# Patient Record
Sex: Female | Born: 1965 | Race: White | Hispanic: No | Smoking: Never smoker
Health system: Southern US, Community
[De-identification: ages and names within clinical notes are randomized; demographics above are authoritative.]

## PROBLEM LIST (undated history)

## (undated) DIAGNOSIS — I739 Peripheral vascular disease, unspecified: Secondary | ICD-10-CM

## (undated) DIAGNOSIS — Z8619 Personal history of other infectious and parasitic diseases: Secondary | ICD-10-CM

## (undated) DIAGNOSIS — I471 Supraventricular tachycardia, unspecified: Secondary | ICD-10-CM

## (undated) DIAGNOSIS — I341 Nonrheumatic mitral (valve) prolapse: Secondary | ICD-10-CM

## (undated) DIAGNOSIS — F32A Depression, unspecified: Secondary | ICD-10-CM

## (undated) DIAGNOSIS — Z8669 Personal history of other diseases of the nervous system and sense organs: Secondary | ICD-10-CM

## (undated) DIAGNOSIS — I809 Phlebitis and thrombophlebitis of unspecified site: Secondary | ICD-10-CM

## (undated) DIAGNOSIS — E785 Hyperlipidemia, unspecified: Secondary | ICD-10-CM

## (undated) DIAGNOSIS — F329 Major depressive disorder, single episode, unspecified: Secondary | ICD-10-CM

## (undated) DIAGNOSIS — I951 Orthostatic hypotension: Secondary | ICD-10-CM

## (undated) DIAGNOSIS — M1990 Unspecified osteoarthritis, unspecified site: Secondary | ICD-10-CM

## (undated) HISTORY — DX: Unspecified osteoarthritis, unspecified site: M19.90

## (undated) HISTORY — DX: Hyperlipidemia, unspecified: E78.5

## (undated) HISTORY — DX: Depression, unspecified: F32.A

## (undated) HISTORY — DX: Personal history of other diseases of the nervous system and sense organs: Z86.69

## (undated) HISTORY — DX: Personal history of other infectious and parasitic diseases: Z86.19

## (undated) HISTORY — DX: Orthostatic hypotension: I95.1

## (undated) HISTORY — DX: Nonrheumatic mitral (valve) prolapse: I34.1

## (undated) HISTORY — DX: Peripheral vascular disease, unspecified: I73.9

## (undated) HISTORY — PX: KNEE SURGERY: SHX244

## (undated) HISTORY — DX: Supraventricular tachycardia, unspecified: I47.10

## (undated) HISTORY — DX: Phlebitis and thrombophlebitis of unspecified site: I80.9

## (undated) HISTORY — DX: Major depressive disorder, single episode, unspecified: F32.9

## (undated) HISTORY — DX: Supraventricular tachycardia: I47.1

---

## 2002-06-11 HISTORY — PX: HERNIA REPAIR: SHX51

## 2004-06-11 HISTORY — PX: CARDIAC CATHETERIZATION: SHX172

## 2012-02-25 ENCOUNTER — Encounter: Payer: Self-pay | Admitting: Family

## 2012-02-25 ENCOUNTER — Ambulatory Visit (INDEPENDENT_AMBULATORY_CARE_PROVIDER_SITE_OTHER): Payer: 59 | Admitting: Family

## 2012-02-25 VITALS — BP 100/70 | HR 78 | Temp 98.4°F | Resp 16 | Ht 70.25 in | Wt 139.0 lb

## 2012-02-25 DIAGNOSIS — G43909 Migraine, unspecified, not intractable, without status migrainosus: Secondary | ICD-10-CM

## 2012-02-25 DIAGNOSIS — I471 Supraventricular tachycardia: Secondary | ICD-10-CM

## 2012-02-25 DIAGNOSIS — I498 Other specified cardiac arrhythmias: Secondary | ICD-10-CM

## 2012-02-25 DIAGNOSIS — F411 Generalized anxiety disorder: Secondary | ICD-10-CM

## 2012-02-25 DIAGNOSIS — M129 Arthropathy, unspecified: Secondary | ICD-10-CM

## 2012-02-25 DIAGNOSIS — F419 Anxiety disorder, unspecified: Secondary | ICD-10-CM

## 2012-02-25 DIAGNOSIS — I951 Orthostatic hypotension: Secondary | ICD-10-CM

## 2012-02-25 DIAGNOSIS — M1990 Unspecified osteoarthritis, unspecified site: Secondary | ICD-10-CM

## 2012-02-25 MED ORDER — FLUDROCORTISONE ACETATE 0.1 MG PO TABS: 0.1000 mg | ORAL_TABLET | Freq: Two times a day (BID) | ORAL | Status: DC

## 2012-02-25 MED ORDER — BUTALBITAL-ASA-CAFF-CODEINE 50-325-40-30 MG PO CAPS: 1.0000 | ORAL_CAPSULE | ORAL | Status: DC | PRN

## 2012-02-25 MED ORDER — DIAZEPAM 5 MG PO TABS: 5.0000 mg | ORAL_TABLET | Freq: Three times a day (TID) | ORAL | Status: DC | PRN
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
## 2012-02-25 NOTE — Patient Instructions (Addendum)
You will be contact about your referral to the headache specialist. Please let us know if you have not heard back within 1 week about your referral. Schedule a fasting physical at your convenience.

## 2012-02-28 DIAGNOSIS — M1990 Unspecified osteoarthritis, unspecified site: Secondary | ICD-10-CM | POA: Insufficient documentation

## 2012-02-28 DIAGNOSIS — F419 Anxiety disorder, unspecified: Secondary | ICD-10-CM | POA: Insufficient documentation

## 2012-02-28 DIAGNOSIS — I951 Orthostatic hypotension: Secondary | ICD-10-CM | POA: Insufficient documentation

## 2012-02-28 DIAGNOSIS — G43909 Migraine, unspecified, not intractable, without status migrainosus: Secondary | ICD-10-CM | POA: Insufficient documentation

## 2012-02-28 NOTE — Assessment & Plan Note (Addendum)
Uncontrolled.  Refer to Neuro for further evaluation.  Rx provided for fiorinal, but we discussed that this is not an ideal medication for long term.

## 2012-02-28 NOTE — Assessment & Plan Note (Signed)
Well controlled off of citalopram.  Monitor.

## 2012-02-28 NOTE — Assessment & Plan Note (Signed)
This affects her back and knees.  She also has neck pain and stiffness for which she uses valium.

## 2012-02-28 NOTE — Assessment & Plan Note (Signed)
Seem controlled with florinef and high sodium diet.  Monitor.

## 2012-03-04 ENCOUNTER — Encounter: Payer: Self-pay | Admitting: Cardiology

## 2012-03-04 ENCOUNTER — Telehealth: Payer: Self-pay | Admitting: Family

## 2012-03-04 ENCOUNTER — Encounter: Payer: Self-pay | Admitting: *Deleted

## 2012-03-04 DIAGNOSIS — I499 Cardiac arrhythmia, unspecified: Secondary | ICD-10-CM | POA: Insufficient documentation

## 2012-03-04 DIAGNOSIS — F32A Depression, unspecified: Secondary | ICD-10-CM | POA: Insufficient documentation

## 2012-03-04 DIAGNOSIS — Z8669 Personal history of other diseases of the nervous system and sense organs: Secondary | ICD-10-CM | POA: Insufficient documentation

## 2012-03-04 DIAGNOSIS — E785 Hyperlipidemia, unspecified: Secondary | ICD-10-CM | POA: Insufficient documentation

## 2012-03-04 DIAGNOSIS — F329 Major depressive disorder, single episode, unspecified: Secondary | ICD-10-CM | POA: Insufficient documentation

## 2012-03-04 DIAGNOSIS — I809 Phlebitis and thrombophlebitis of unspecified site: Secondary | ICD-10-CM | POA: Insufficient documentation

## 2012-03-04 DIAGNOSIS — Z8619 Personal history of other infectious and parasitic diseases: Secondary | ICD-10-CM | POA: Insufficient documentation

## 2012-03-04 NOTE — Telephone Encounter (Signed)
Received medical records from [facility]-Dr. Yunus  P: [phone] F: [phone]

## 2012-03-05 ENCOUNTER — Ambulatory Visit (INDEPENDENT_AMBULATORY_CARE_PROVIDER_SITE_OTHER): Payer: 59 | Admitting: Cardiology

## 2012-03-05 ENCOUNTER — Encounter: Payer: Self-pay | Admitting: Cardiology

## 2012-03-05 VITALS — BP 115/80 | HR 72 | Ht 70.0 in | Wt 140.0 lb

## 2012-03-05 DIAGNOSIS — I341 Nonrheumatic mitral (valve) prolapse: Secondary | ICD-10-CM

## 2012-03-05 DIAGNOSIS — R002 Palpitations: Secondary | ICD-10-CM

## 2012-03-05 DIAGNOSIS — I059 Rheumatic mitral valve disease, unspecified: Secondary | ICD-10-CM

## 2012-03-05 DIAGNOSIS — I951 Orthostatic hypotension: Secondary | ICD-10-CM

## 2012-03-05 NOTE — Progress Notes (Signed)
HPI: 46 year old female for evaluation of palpitations. Patient states she had an evaluation in [place] previously for chest pain, dyspnea and palpitations. This included a cardiac catheterization which she states showed normal coronary arteries. Records are not available. She also was told she had mitral valve prolapse and SVT. She has had previous tilt table which apparently showed significant orthostasis and she is treated with Florinef. She then moved to [place] and had an echocardiogram in March of 2012 which showed normal LV function, mild mitral regurgitation and trace tricuspid regurgitation. Monitor showed sinus rhythm with occasional PVC. She recently moved to this area. She presents for evaluation of the above. She has some dyspnea on exertion which is chronic and unchanged. No orthopnea or PND but chronic mild pedal edema. She occasionally has chest pressure with stress but not with exertion. She occasionally has palpitations that are nonsustained. All of her symptoms are chronic and unchanged by her report. She has some dizziness with standing.  Current Outpatient Prescriptions  Medication Sig Dispense Refill  . butalbital-aspirin-caffeine-codeine (FIORINAL WITH CODEINE) 50-325-40-30 MG capsule Take 1 capsule by mouth every 4 (four) hours as needed.  90 capsule  0  . CALCIUM CITRATE-VITAMIN D PO Take 1 tablet by mouth daily.      . citalopram (CELEXA) 40 MG tablet Take 40 mg by mouth daily.      . diazepam (VALIUM) 5 MG tablet Take 1 tablet (5 mg total) by mouth every 8 (eight) hours as needed for anxiety.  90 tablet  0  . Ergocalciferol (VITAMIN D2) 2000 UNITS TABS Take 1 tablet by mouth daily.      . fludrocortisone (FLORINEF) 0.1 MG tablet Take 1 tablet (0.1 mg total) by mouth 2 (two) times daily.  60 tablet  2  . Multiple Vitamins-Minerals (CENTRUM ULTRA WOMENS PO) Take 1 tablet by mouth daily.      . vitamin E 400 UNIT capsule Take 800 Units by mouth daily.        Allergies    Allergen Reactions  . Sulfa Antibiotics Swelling    Past Medical History  Diagnosis Date  . Arthritis   . Depression   . History of chicken pox   . History of migraine   . Hyperlipidemia   . Phlebitis     Pt reports phlebitis while wearing a leg immobilizer years ago  . MVP (mitral valve prolapse)   . SVT (supraventricular tachycardia)   . Orthostatic hypotension     Past Surgical History  Procedure Date  . Knee surgery     x 4  . Hernia repair 2004    right inguinal  . Cardiac catheterization 2006    Performed in [place], no CAD    History   Social History  . Marital Status: Divorced    Spouse Name: N/A    Number of Children: 1  . Years of Education: N/A   Occupational History  .  [employer]   Social History Main Topics  . Smoking status: Never Smoker   . Smokeless tobacco: Never Used  . Alcohol Use: Yes     2 per month  . Drug Use: Not on file  . Sexually Active: Not on file   Other Topics Concern  . Not on file   Social History Narrative   Regular exercise:  NoCaffeine use:  2 cups coffee dailyPatient is EngagedAccountantBachelors degreeShe has one son age 19She enjoys hiking    Family History  Problem Relation Age of Onset  .  Arthritis Mother   . Hyperlipidemia Mother   . Hypertension Mother   . Vitamin D deficiency Mother   . Arthritis Father   . Hyperlipidemia Father   . Hypertension Father   . Thyroid disease Sister   . Vitamin D deficiency Sister   . Hyperlipidemia Maternal Grandmother   . Hypertension Maternal Grandmother   . Aneurysm Maternal Grandmother     abdmonial  . Hypertension Maternal Grandfather   . Hyperlipidemia Paternal Grandmother   . Hypertension Paternal Grandmother   . Hypertension Paternal Grandfather   . Aneurysm Brother     abdominal    ROS:  no fevers or chills, productive cough, hemoptysis, dysphasia, odynophagia, melena, hematochezia, dysuria, hematuria, rash, seizure activity, orthopnea, PND,  claudication. Remaining systems are negative.  Physical Exam:  Blood pressure 115/80, pulse 72, height 5\' 10"  (1.778 m), weight 140 lb (63.504 kg), last menstrual period 01/30/2012.  General:  Well developed/well nourished in NAD Skin warm/dry Patient not depressed No peripheral clubbing Back-normal HEENT-normal/normal eyelids Neck supple/normal carotid upstroke bilaterally; no bruits; no JVD; no thyromegaly chest - CTA/ normal expansion CV - RRR/normal S1 and S2; no murmurs, rubs or gallops;  PMI nondisplaced Abdomen -NT/ND, no HSM, no mass, + bowel sounds, no bruit 2+ femoral pulses, no bruits Ext-no edema, chords, 2+ DP Neuro-grossly nonfocal  ECG sinus rhythm at a rate of 72. Normal axis. No significant ST changes.

## 2012-03-05 NOTE — Assessment & Plan Note (Signed)
Patient has had long-standing palpitations. She states a previous monitor in [place] showed SVT. A monitor in [place] showed only isolated PVC. Her symptoms are actually improved compared to previous and also chronic. Will not pursue further evaluation at this point. I will obtain all previous records from [place] and [place] for review.

## 2012-03-05 NOTE — Patient Instructions (Addendum)
Your physician wants you to follow-up in: ONE YEAR WITH DR CRENSHAW IN [place] You will receive a reminder letter in the mail two months in advance. If you don't receive a letter, please call our office to schedule the follow-up appointment.  

## 2012-03-05 NOTE — Assessment & Plan Note (Signed)
Symptoms appear to be reasonable. Continue Florinef. Obtain previous records concerning tilt table.

## 2012-03-14 ENCOUNTER — Other Ambulatory Visit: Payer: Self-pay | Admitting: Family

## 2012-03-14 ENCOUNTER — Encounter: Payer: Self-pay | Admitting: Family

## 2012-03-14 ENCOUNTER — Ambulatory Visit (INDEPENDENT_AMBULATORY_CARE_PROVIDER_SITE_OTHER): Payer: 59 | Admitting: Family

## 2012-03-14 VITALS — BP 100/70 | HR 78 | Temp 98.0°F | Resp 16 | Ht 70.25 in | Wt 143.0 lb

## 2012-03-14 DIAGNOSIS — M2550 Pain in unspecified joint: Secondary | ICD-10-CM

## 2012-03-14 DIAGNOSIS — Z0000 Encounter for general adult medical examination without abnormal findings: Secondary | ICD-10-CM

## 2012-03-14 LAB — HEPATIC FUNCTION PANEL
AST: 18 U/L (ref 0–37)
Albumin: 3.7 g/dL (ref 3.5–5.2)
Alkaline Phosphatase: 54 U/L (ref 39–117)
Total Protein: 7.1 g/dL (ref 6.0–8.3)

## 2012-03-14 LAB — CBC WITH DIFFERENTIAL/PLATELET
Basophils Absolute: 0 10*3/uL (ref 0.0–0.1)
Basophils Relative: 1 % (ref 0–1)
MCHC: 32.4 g/dL (ref 30.0–36.0)
Neutro Abs: 2.4 10*3/uL (ref 1.7–7.7)
Neutrophils Relative %: 62 % (ref 43–77)
Platelets: 303 10*3/uL (ref 150–400)
RDW: 13.7 % (ref 11.5–15.5)

## 2012-03-14 LAB — BASIC METABOLIC PANEL WITH GFR
BUN: 11 mg/dL (ref 6–23)
CO2: 26 mEq/L (ref 19–32)
Calcium: 9.2 mg/dL (ref 8.4–10.5)
Chloride: 103 mEq/L (ref 96–112)
Creat: 0.76 mg/dL (ref 0.50–1.10)
GFR, Est Non African American: >89 mL/min

## 2012-03-14 LAB — RHEUMATOID FACTOR: Rhuematoid fact SerPl-aCnc: <10 IU/mL (ref <=14)

## 2012-03-14 LAB — LIPID PANEL
Cholesterol: 240 mg/dL — ABNORMAL HIGH (ref 0–200)
LDL Cholesterol: 151 mg/dL — ABNORMAL HIGH (ref 0–99)
Triglycerides: 59 mg/dL (ref <150)

## 2012-03-14 NOTE — Progress Notes (Signed)
Subjective:    Patient ID: Denise [name], female    DOB: 1966-03-31, 46 y.o.   MRN: [id number]  HPI  Patient presents today for complete physical.  Immunizations:Tetanus up to date. Declines flu shot Diet: Not eating healthy.   Colonoscopy: Plan to start at age 39 Pap Smear: Will complete with GYN Mammogram:Due for mammogram  Review of Systems  Constitutional: Positive for fatigue.       8-9 hours sleep a day  HENT: Negative for hearing loss and congestion.   Eyes: Negative for visual disturbance.  Respiratory: Negative for cough.   Cardiovascular: Positive for leg swelling.  Gastrointestinal: Negative for vomiting, diarrhea, constipation and blood in stool.  Genitourinary: Negative for menstrual problem.  Musculoskeletal:       Knee pain, hip pain, shoulder pain, back pain from scoliosis  Skin: Negative for rash.  Neurological: Positive for headaches.  Hematological: Does not bruise/bleed easily.  Psychiatric/Behavioral:       Reports mood is generally good   Past Medical History  Diagnosis Date  . Arthritis   . Depression   . History of chicken pox   . History of migraine   . Hyperlipidemia   . Phlebitis     Pt reports phlebitis while wearing a leg immobilizer years ago  . MVP (mitral valve prolapse)   . SVT (supraventricular tachycardia)   . Orthostatic hypotension     History   Social History  . Marital Status: Divorced    Spouse Name: N/A    Number of Children: 1  . Years of Education: N/A   Occupational History  .  [employer]   Social History Main Topics  . Smoking status: Never Smoker   . Smokeless tobacco: Never Used  . Alcohol Use: Yes     2 per month  . Drug Use: Not on file  . Sexually Active: Not on file   Other Topics Concern  . Not on file   Social History Narrative   Regular exercise:  NoCaffeine use:  2 cups coffee dailyPatient is EngagedAccountantBachelors degreeShe has one son age 19She enjoys hiking    Past Surgical  History  Procedure Date  . Knee surgery     x 4  . Hernia repair 2004    right inguinal  . Cardiac catheterization 2006    Performed in [place], no CAD    Family History  Problem Relation Age of Onset  . Arthritis Mother   . Hyperlipidemia Mother   . Hypertension Mother   . Vitamin D deficiency Mother   . Arthritis Father   . Hyperlipidemia Father   . Hypertension Father   . Thyroid disease Sister   . Vitamin D deficiency Sister   . Hyperlipidemia Maternal Grandmother   . Hypertension Maternal Grandmother   . Aneurysm Maternal Grandmother     abdmonial  . Hypertension Maternal Grandfather   . Hyperlipidemia Paternal Grandmother   . Hypertension Paternal Grandmother   . Hypertension Paternal Grandfather   . Aneurysm Brother     abdominal    Allergies  Allergen Reactions  . Sulfa Antibiotics Swelling    Current Outpatient Prescriptions on File Prior to Visit  Medication Sig Dispense Refill  . butalbital-aspirin-caffeine-codeine (FIORINAL WITH CODEINE) 50-325-40-30 MG capsule Take 1 capsule by mouth every 4 (four) hours as needed.  90 capsule  0  . CALCIUM CITRATE-VITAMIN D PO Take 1 tablet by mouth daily.      . citalopram (CELEXA) 40 MG tablet Take 40  mg by mouth daily.      . diazepam (VALIUM) 5 MG tablet Take 1 tablet (5 mg total) by mouth every 8 (eight) hours as needed for anxiety.  90 tablet  0  . Ergocalciferol (VITAMIN D2) 2000 UNITS TABS Take 1 tablet by mouth daily.      . fludrocortisone (FLORINEF) 0.1 MG tablet Take 1 tablet (0.1 mg total) by mouth 2 (two) times daily.  60 tablet  2  . Multiple Vitamins-Minerals (CENTRUM ULTRA WOMENS PO) Take 1 tablet by mouth daily.      . vitamin E 400 UNIT capsule Take 800 Units by mouth daily.        BP 100/70  Pulse 78  Temp 98 F (36.7 C) (Oral)  Resp 16  Ht 5' 10.25" (1.784 m)  Wt 143 lb (64.864 kg)  BMI 20.37 kg/m2  LMP 02/27/2012        Objective:   Physical Exam Physical Exam  Constitutional: She is  oriented to person, place, and time. She appears well-developed and well-nourished. No distress.  HENT:  Head: Normocephalic and atraumatic.  Right Ear: Tympanic membrane and ear canal normal.  Left Ear: Tympanic membrane and ear canal normal.  Mouth/Throat: Oropharynx is clear and moist.  Eyes: Pupils are equal, round, and reactive to light. No scleral icterus.  Neck: Normal range of motion. No thyromegaly present.  Cardiovascular: Normal rate and regular rhythm.   No murmur heard. Pulmonary/Chest: Effort normal and breath sounds normal. No respiratory distress. He has no wheezes. She has no rales. She exhibits no tenderness.  Abdominal: Soft. Bowel sounds are normal. He exhibits no distension and no mass. There is no tenderness. There is no rebound and no guarding.  Musculoskeletal: She exhibits no edema.  Lymphadenopathy:    She has no cervical adenopathy.  Neurological: She is alert and oriented to person, place, and time.  She exhibits normal muscle tone. Coordination normal.  Skin: Skin is warm and dry.  Psychiatric: She has a normal mood and affect. Her behavior is normal. Judgment and thought content normal.  Breasts: Examined lying Right: Without masses, retractions, discharge or axillary adenopathy.  Left: Without masses, retractions, discharge or axillary adenopathy.  Pelvic- defer to GYN          Assessment & Plan:          Assessment & Plan:

## 2012-03-14 NOTE — Assessment & Plan Note (Signed)
Will check ANA,RA,ESR.   

## 2012-03-14 NOTE — Patient Instructions (Addendum)
Please schedule mammogram on the first floor. Complete your blood work prior to leaving. Follow up in 6 months, sooner if problems/concerns.

## 2012-03-15 LAB — URINALYSIS, ROUTINE W REFLEX MICROSCOPIC
Bilirubin Urine: NEGATIVE
Glucose, UA: NEGATIVE mg/dL
Specific Gravity, Urine: 1.008 (ref 1.005–1.030)
Urobilinogen, UA: 0.2 mg/dL (ref 0.0–1.0)

## 2012-03-15 LAB — VITAMIN D 25 HYDROXY (VIT D DEFICIENCY, FRACTURES): Vit D, 25-Hydroxy: 42 ng/mL (ref 30–89)

## 2012-03-17 LAB — ANA: Anti Nuclear Antibody(ANA): NEGATIVE

## 2012-03-18 ENCOUNTER — Encounter: Payer: Self-pay | Admitting: Family

## 2012-03-18 DIAGNOSIS — D649 Anemia, unspecified: Secondary | ICD-10-CM | POA: Insufficient documentation

## 2012-03-19 ENCOUNTER — Other Ambulatory Visit: Payer: Self-pay | Admitting: Family

## 2012-03-19 DIAGNOSIS — Z1231 Encounter for screening mammogram for malignant neoplasm of breast: Secondary | ICD-10-CM

## 2012-03-19 LAB — VITAMIN B12: Vitamin B-12: 263 pg/mL (ref 211–911)

## 2012-03-19 LAB — IRON AND TIBC: TIBC: 323 ug/dL (ref 250–470)

## 2012-03-19 LAB — FOLATE: Folate: 4.8 ng/mL

## 2012-03-20 ENCOUNTER — Telehealth: Payer: Self-pay | Admitting: Family

## 2012-03-20 ENCOUNTER — Ambulatory Visit (HOSPITAL_BASED_OUTPATIENT_CLINIC_OR_DEPARTMENT_OTHER)
Admission: RE | Admit: 2012-03-20 | Discharge: 2012-03-20 | Disposition: A | Discharge status: Home / Self Care | Payer: 59 | Source: Ambulatory Visit | Attending: Family | Admitting: Family

## 2012-03-20 ENCOUNTER — Inpatient Hospital Stay (HOSPITAL_BASED_OUTPATIENT_CLINIC_OR_DEPARTMENT_OTHER): Admission: RE | Admit: 2012-03-20 | Payer: 59 | Source: Ambulatory Visit

## 2012-03-20 DIAGNOSIS — E785 Hyperlipidemia, unspecified: Secondary | ICD-10-CM

## 2012-03-20 DIAGNOSIS — D649 Anemia, unspecified: Secondary | ICD-10-CM

## 2012-03-20 DIAGNOSIS — Z1231 Encounter for screening mammogram for malignant neoplasm of breast: Secondary | ICD-10-CM | POA: Insufficient documentation

## 2012-03-20 NOTE — Telephone Encounter (Signed)
  Please call pt and let her know that rheumatoid arthritis and lupus testing is normal. Vitamin D is normal. Kidney function/sugar normal. Liver function good. Mild anemia. She should continue multivitamin with minerals please. Cholesterol elevated. She should work on a low fat/low choesterol diet and plan to repeat FLP in 6 months. I would also like her to complete IFOB (dx anemia). thanks

## 2012-03-21 NOTE — Telephone Encounter (Signed)
Left message to return my call.  

## 2012-03-24 NOTE — Telephone Encounter (Signed)
Notified pt, she will return for IFOB kit around Wednesday at 8am.

## 2012-03-24 NOTE — Telephone Encounter (Signed)
Left detailed message re: instructions below and to call us back to arrange IFOB and future lab orders.

## 2012-03-26 NOTE — Telephone Encounter (Signed)
Pt picked up IFOB, future lab order entered and given to the lab.

## 2012-03-27 ENCOUNTER — Other Ambulatory Visit: Payer: Self-pay | Admitting: Family

## 2012-03-28 NOTE — Telephone Encounter (Signed)
Rx printed. Called in to pharmacy; [facility] - [address]

## 2012-03-28 NOTE — Telephone Encounter (Signed)
Please advise regarding Valium refill. Last refill on 02/25/12 # 90

## 2012-03-28 NOTE — Telephone Encounter (Signed)
OK to send #90 with zero refills.  

## 2012-03-28 NOTE — Telephone Encounter (Signed)
Rx printed. Called in to pharmacy at [facility] - [address]

## 2012-04-16 ENCOUNTER — Telehealth: Payer: Self-pay | Admitting: Family

## 2012-04-16 MED ORDER — BUTALBITAL-ASA-CAFF-CODEINE 50-325-40-30 MG PO CAPS: 1.0000 | ORAL_CAPSULE | ORAL | Status: DC | PRN

## 2012-04-16 NOTE — Telephone Encounter (Signed)
OK to send #90 with zero refills.  Would you please ask pt the status of her neurology referral?

## 2012-04-16 NOTE — Telephone Encounter (Signed)
Spoke with Brook at [facility] and verified that refill of 03/11/12 was from Rx on 02/25/12. Please advise re: refill.

## 2012-04-16 NOTE — Telephone Encounter (Signed)
Refill- butalb/aspirin/caff/codeine 30mg  cap. Take one capsule by mouth every 4 hours as needed. Qty 90 last fill 10.1.13

## 2012-04-16 NOTE — Telephone Encounter (Signed)
Refill called to pharmacy voicemail. Per referral notes, pt will see Dr Ledell Peoples at [facility] on 04/28/12.

## 2012-04-28 ENCOUNTER — Telehealth: Payer: Self-pay | Admitting: *Deleted

## 2012-04-28 ENCOUNTER — Telehealth: Payer: Self-pay | Admitting: Family

## 2012-04-28 MED ORDER — DIAZEPAM 5 MG PO TABS: 5.0000 mg | ORAL_TABLET | Freq: Three times a day (TID) | ORAL | Status: DC | PRN

## 2012-04-28 NOTE — Telephone Encounter (Signed)
Refill called to pharmacy voicemail. 

## 2012-04-28 NOTE — Telephone Encounter (Signed)
OK 

## 2012-04-28 NOTE — Telephone Encounter (Signed)
Received message from pt stating she saw the neurologist at [facility] today and they ordered a sleep study to evaluate possible cause of pt's migraines. Pt wants to know if there is somewhere local that she can have test performed instead of going back to [facility] to complete it. Left detailed message on pt's cell that [facility] a sleep lab. If [facility] would be closer for her advised pt to contact [facility] to see if they can place order locally and to call if she has any questions.

## 2012-04-28 NOTE — Telephone Encounter (Signed)
Refill- diazepam 5mg  tablets. Take one tablet every 8 hours as needed for anxiety. Qty 90 last fill 10.18.13

## 2012-04-28 NOTE — Telephone Encounter (Signed)
Rx last filled on 03/28/12. Is it ok to give #90 x no refills.

## 2012-05-23 ENCOUNTER — Telehealth: Payer: Self-pay | Admitting: Family

## 2012-05-23 NOTE — Telephone Encounter (Signed)
Additional refills should come from Neurology please.

## 2012-05-23 NOTE — Telephone Encounter (Signed)
Please advise re: additional refills.

## 2012-05-23 NOTE — Telephone Encounter (Signed)
Refill denied, notified pharmacist and he will send to appropriate doctor.

## 2012-05-23 NOTE — Telephone Encounter (Signed)
Refill- ascomp w/codeine 30mg  capsules. Take one capsule by mouth every 4 hours as needed. Qty 90 last fill 11.06.13

## 2012-05-26 ENCOUNTER — Other Ambulatory Visit: Payer: Self-pay | Admitting: *Deleted

## 2012-05-26 MED ORDER — BUTALBITAL-ASA-CAFF-CODEINE 50-325-40-30 MG PO CAPS: 1.0000 | ORAL_CAPSULE | ORAL | Status: DC | PRN

## 2012-05-26 NOTE — Telephone Encounter (Signed)
Received call from pt stating she only has 1 Fiorinal left and is home with a migraine today. Advised pt that I spoke with the pharmacist on Friday and asked them to send request to her neurologist. Pt states Dr. Dennard Nip [phone]) told her that they will not refill Fiorinal and she would have to contact us for future refills. Spoke to Abby at [facility] and verified they are waiting to hear back from Dr Ulysees Barns. Spoke with Fleet Contras at Dr Risa Grill office and left message for them to call us back with clarification re: medication management. Fleet Contras states Dr Risa Grill nurse, Lanice Schwab will return my call.

## 2012-05-26 NOTE — Telephone Encounter (Signed)
Notified Provider of current request. Received verbal to give 2 week supply. Called pt to notify her and she states that Dr Ulysees Barns prescribed her medrol dose pack to try and if that didn't work they were going to try an occipital nerve block. She states she is trying to prolong the nerve block. #45 fiorinal called to [facility].

## 2012-05-27 NOTE — Telephone Encounter (Signed)
Received call from Dr Risa Grill nurse. She reports that Dr Ulysees Barns told pt she does not prescribe fioricet. She wanted to try pt on a trial of Effexor, medrol dose pack. If symptoms not relieved with these measures to complete sleep study, psychiatry referral. She will fax copy of consultation note to [place].

## 2012-06-03 ENCOUNTER — Telehealth: Payer: Self-pay | Admitting: *Deleted

## 2012-06-03 MED ORDER — BUTALBITAL-ASA-CAFF-CODEINE 50-325-40-30 MG PO CAPS: ORAL_CAPSULE | ORAL | Status: DC

## 2012-06-03 MED ORDER — DIAZEPAM 5 MG PO TABS: 5.0000 mg | ORAL_TABLET | Freq: Three times a day (TID) | ORAL | Status: DC | PRN

## 2012-06-03 NOTE — Telephone Encounter (Signed)
I will instruct pt to follow neurology recommendations for migraine management. Valium last phoned in on 04/28/12.

## 2012-06-03 NOTE — Telephone Encounter (Signed)
Pt states that she spoke w/you last week RE: migraine medication & [2] weeks of medication was sent to pharmacy [12.16.13 #45x0], pt requesting to have another refill sent to pharmacy d/t leaving for 10-day cruise on Saturday & "concerned that she won't have enough". Also, pt requesting refill for Valium to pharmacy [last Rx 11.18.13 #[id number]]/SLS Please advise. Thanks.

## 2012-06-03 NOTE — Telephone Encounter (Signed)
Notified pt of instructions below. She voices understanding and is agreeable to taper of fioricet. Pt has been made aware to contact neurology as soon as possible to determine the next step her in therapy. Pt reports that she has also contacted [facility] to arrange a sleep study per neurology. Valium three times daily as needed, #90 x no refills and Fioricet 1 tablet three times a day for 3 days, then 1 tablet twice a day for 3 days, then 1 tablet daily for 3 days then 1 tablet every other day for 1 week then stop. Rxs called to Chad at [place].

## 2012-06-03 NOTE — Telephone Encounter (Signed)
OK to send #90 tabs of fioricet no refills- do not fill prior to 12/27.   OK to send #90 of Valium no refills.  Neurology is not recommending fioricet for rx of her migraines.  I am not comfortable continuing to prescribe this for her.  I will send one final refill for her to begin to taper after she returns from her cruise.  Taper plan is as follows:  Fioricet drop by 1 dose every 3 days. when you are down to one a day, go to every other day for 1 week then stop  I recommend that she follow through with neurology recommendations for migraine treatment: Effexor, medrol dose pack. If symptoms not relieved with these measures to complete sleep study, psychiatry referral.  Arrange follow up with neuro if she has not already done so.

## 2012-07-21 ENCOUNTER — Telehealth: Payer: Self-pay | Admitting: Family

## 2012-07-21 NOTE — Telephone Encounter (Signed)
Refill- diazepam 5mg  tablets. Take one tablet by mouth every 8 hours as needed for anxiety. Qty 90 last fill 12.24.13

## 2012-07-22 MED ORDER — DIAZEPAM 5 MG PO TABS: 5.0000 mg | ORAL_TABLET | Freq: Three times a day (TID) | ORAL | Status: DC | PRN

## 2012-07-22 NOTE — Telephone Encounter (Signed)
Refill left on pharmacy voicemail #90 x no refills.

## 2012-07-29 ENCOUNTER — Encounter: Payer: Self-pay | Admitting: Family

## 2012-07-29 ENCOUNTER — Ambulatory Visit (INDEPENDENT_AMBULATORY_CARE_PROVIDER_SITE_OTHER): Payer: 59 | Admitting: Family

## 2012-07-29 VITALS — BP 106/70 | HR 83 | Temp 98.5°F | Resp 16 | Wt 140.0 lb

## 2012-07-29 DIAGNOSIS — J020 Streptococcal pharyngitis: Secondary | ICD-10-CM

## 2012-07-29 DIAGNOSIS — J029 Acute pharyngitis, unspecified: Secondary | ICD-10-CM

## 2012-07-29 MED ORDER — AMOXICILLIN-POT CLAVULANATE 875-125 MG PO TABS: 1.0000 | ORAL_TABLET | Freq: Two times a day (BID) | ORAL | Status: DC

## 2012-07-29 NOTE — Progress Notes (Signed)
Subjective:    Patient ID: Denise [name], female    DOB: [birth year], 47 y.o.   MRN: [id number]  HPI  Sore throat started Thursday. She reports fever 100 yesterday. Sore throat 8/10.  She reports Bad sinus pressure in the early morning and at night.  Rattling in chest.    Review of Systems See HPI  Past Medical History  Diagnosis Date  . Arthritis   . Depression   . History of chicken pox   . History of migraine   . Hyperlipidemia   . Phlebitis     Pt reports phlebitis while wearing a leg immobilizer years ago  . MVP (mitral valve prolapse)   . SVT (supraventricular tachycardia)   . Orthostatic hypotension     History   Social History  . Marital Status: Divorced    Spouse Name: N/A    Number of Children: 1  . Years of Education: N/A   Occupational History  .  [employer]   Social History Main Topics  . Smoking status: Never Smoker   . Smokeless tobacco: Never Used  . Alcohol Use: Yes     Comment: 2 per month  . Drug Use: Not on file  . Sexually Active: Not on file   Other Topics Concern  . 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 Knee surgery      x 4  . Hernia repair  2004    right inguinal  . Cardiac catheterization  2006    Performed in [place], no CAD    Family History  Problem Relation Age of Onset  . Arthritis Mother   . Hyperlipidemia Mother   . Hypertension Mother   . Vitamin D deficiency Mother   . Arthritis Father   . Hyperlipidemia Father   . Hypertension Father   . Thyroid disease Sister   . Vitamin D deficiency Sister   . Hyperlipidemia Maternal Grandmother   . Hypertension Maternal Grandmother   . Aneurysm Maternal Grandmother     abdmonial  . Hypertension Maternal Grandfather   . Hyperlipidemia Paternal  Grandmother   . Hypertension Paternal Grandmother   . Hypertension Paternal Grandfather   . Aneurysm Brother     abdominal    Allergies  Allergen Reactions  . Sulfa Antibiotics Swelling    Current Outpatient Prescriptions on File Prior to Visit  Medication Sig Dispense Refill  . CALCIUM CITRATE-VITAMIN D PO Take 1 tablet by mouth daily.      . citalopram (CELEXA) 40 MG tablet Take 40 mg by mouth daily.      . diazepam (VALIUM) 5 MG tablet Take 1 tablet (5 mg total) by mouth every 8 (eight) hours as needed for anxiety.  90 tablet  0  . Ergocalciferol (VITAMIN D2) 2000 UNITS TABS Take 1 tablet by mouth daily.      . fludrocortisone (FLORINEF) 0.1 MG tablet Take 1 tablet (0.1 mg total) by mouth 2 (two) times daily.  60 tablet  2  . Multiple Vitamins-Minerals (CENTRUM ULTRA WOMENS PO) Take 1 tablet by mouth daily.      . vitamin E 400 UNIT capsule Take 800 Units by  mouth daily.      . butalbital-aspirin-caffeine-codeine (FIORINAL WITH CODEINE) 50-325-40-30 MG capsule Take 1 capsule three times daily for 3 days, 1 capsule twice a day for 3 days, 1 capsule daily for 3 days then 1 capsule every other day for 1 week then stop.  90 capsule  0   No current facility-administered medications on file prior to visit.    BP 106/70  Pulse 83  Temp(Src) 98.5 F (36.9 C) (Oral)  Resp 16  Wt 140 lb 0.6 oz (63.522 kg)  BMI 19.96 kg/m2  SpO2 98%       Objective:   Physical Exam  Constitutional: She is oriented to person, place, and time. She appears well-developed and well-nourished. No distress.  HENT:  Head: Normocephalic and atraumatic.  Right Ear: Tympanic membrane and ear canal normal.  Left Ear: Tympanic membrane and ear canal normal.  + erythema, ? Exudate- difficult to see well due to gagging  Lymphadenopathy:    She has cervical adenopathy.  Neurological: She is alert and oriented to person, place, and time.  Psychiatric: She has a normal mood and affect. Her behavior is normal.  Judgment and thought content normal.          Assessment & Plan:

## 2012-07-29 NOTE — Patient Instructions (Addendum)
Please call if symptoms worsen or if no improvement in 2-3 days.  

## 2012-07-30 DIAGNOSIS — J020 Streptococcal pharyngitis: Secondary | ICD-10-CM | POA: Insufficient documentation

## 2012-07-30 NOTE — Assessment & Plan Note (Signed)
rx with augmentin. Rapid strep positive.

## 2012-09-10 ENCOUNTER — Telehealth: Payer: Self-pay | Admitting: *Deleted

## 2012-09-10 NOTE — Telephone Encounter (Signed)
Noted  

## 2012-09-10 NOTE — Telephone Encounter (Signed)
Received message from pt stating she requested a refill from her pharmacy on Monday and we still have not responded. States that she wants to sign a records release to transfer her records to her new doctor and requests that we do not send refill in at this time as she will have her new doctor refill Rx. Upon review of EPIC I do not see that we have received request via eRx or fax for any medication since February. Attempted to reach pt and left detailed message re: above on voicemail. Advised pt she may stop by the office Monday through Friday from 8am to 5pm to sign a release or call us if she has any questions.

## 2013-09-20 IMAGING — MG MM DIGITAL SCREENING BILAT W/ CAD
5 series · 5 of 5 positions shown · non-contrast
Comparison: 07/19/2010 and 12/22/2008 from [facility]

***ADDENDUM*** CREATED: 04/07/2012 [DATE]
CLINICAL DATA: Screening.

DIGITAL BILATERAL SCREENING MAMMOGRAM
BILATERAL DIGITAL SCREENING MAMMOGRAM WITH CAD

[R CC]
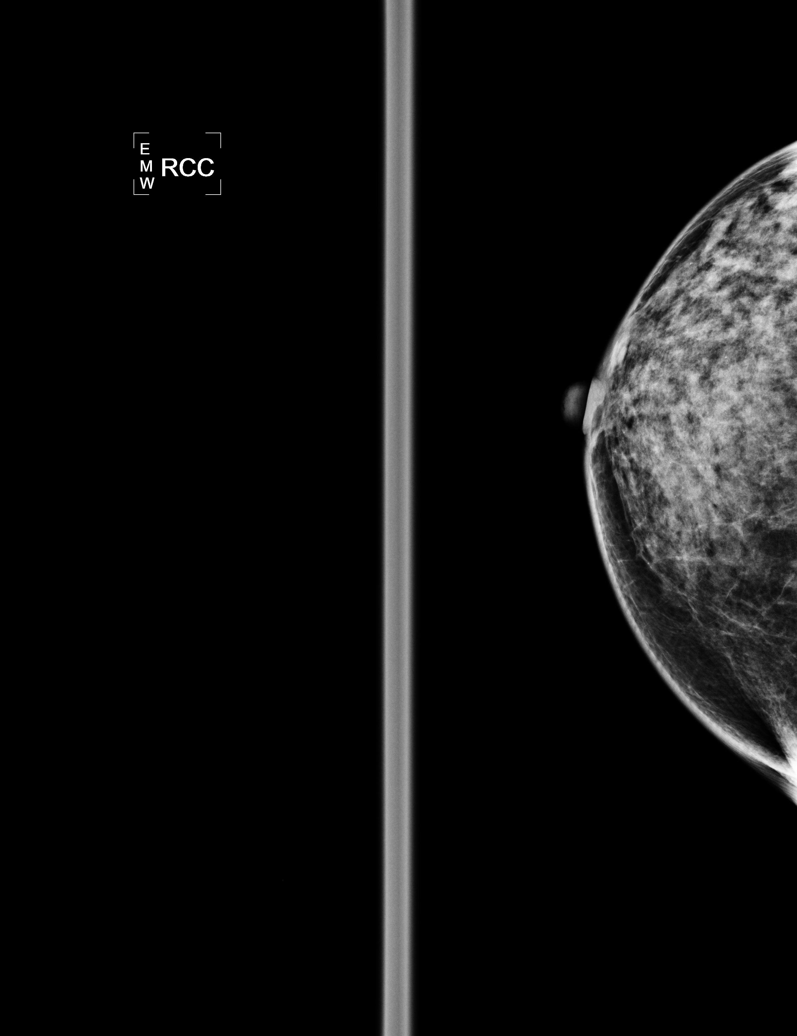

[L CC]
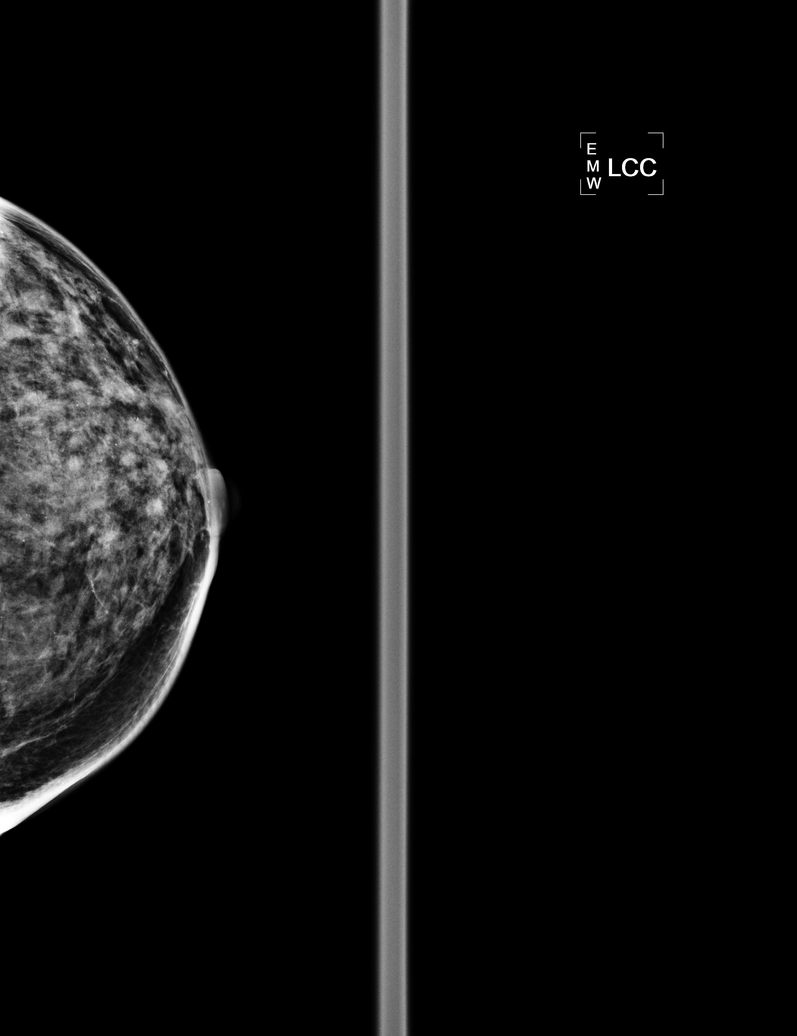

[L MLO]
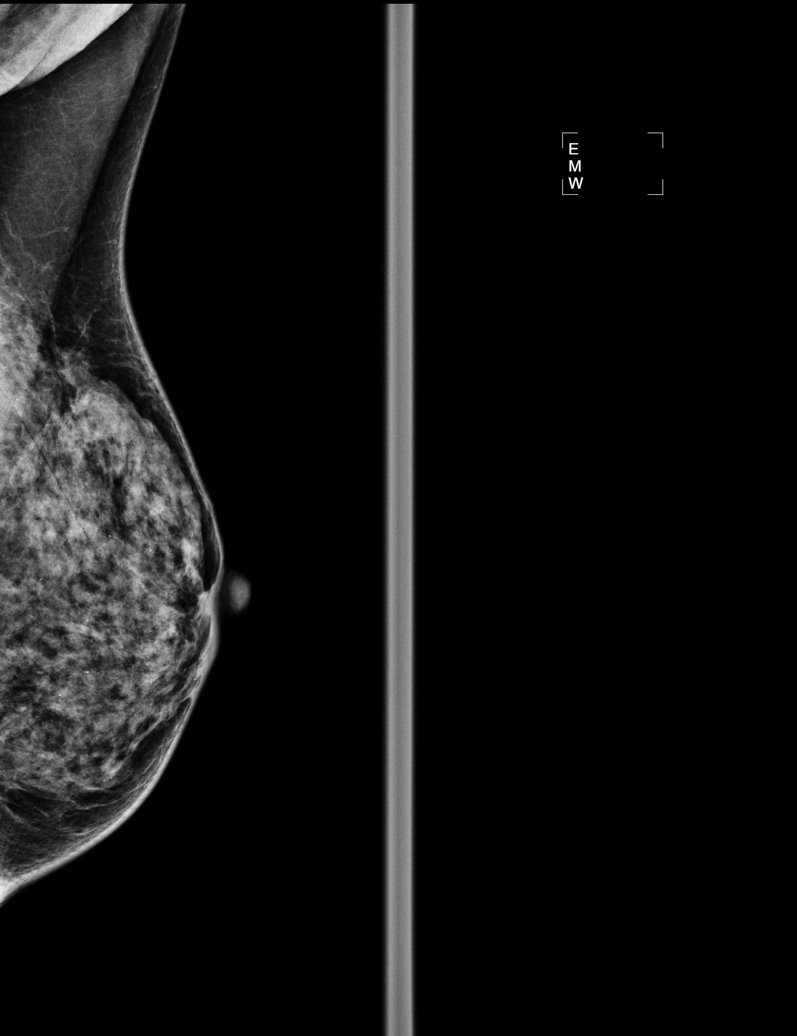

[R MLO (1 of 2)]
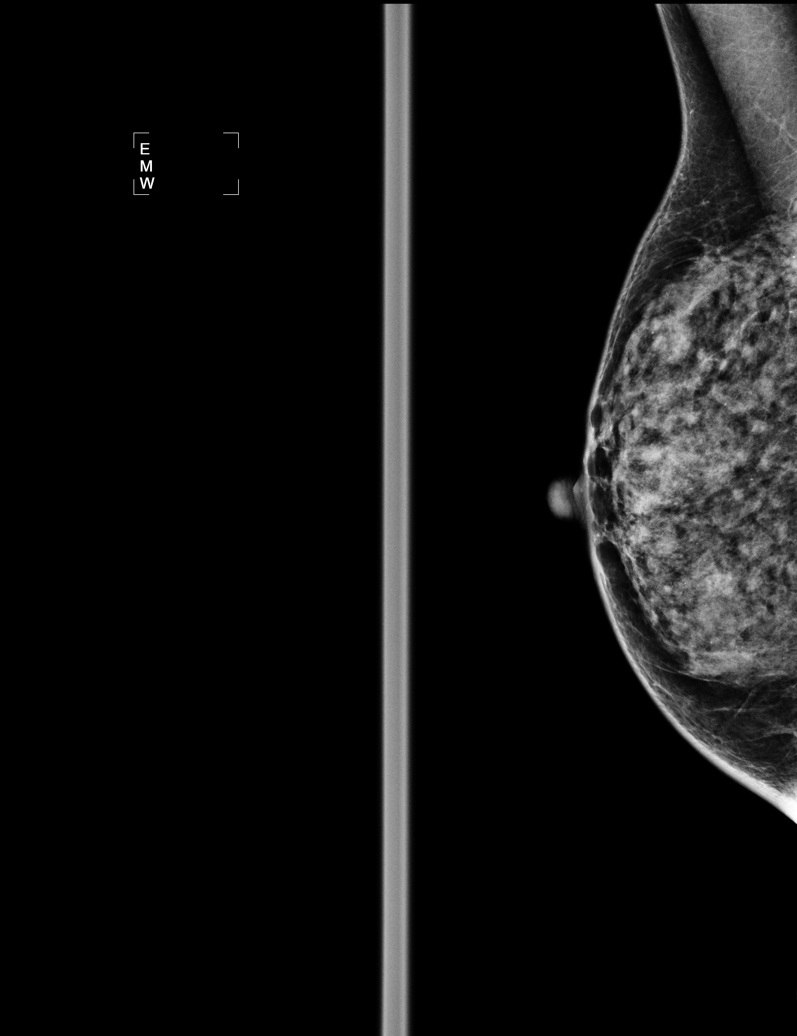

[R MLO (2 of 2)]
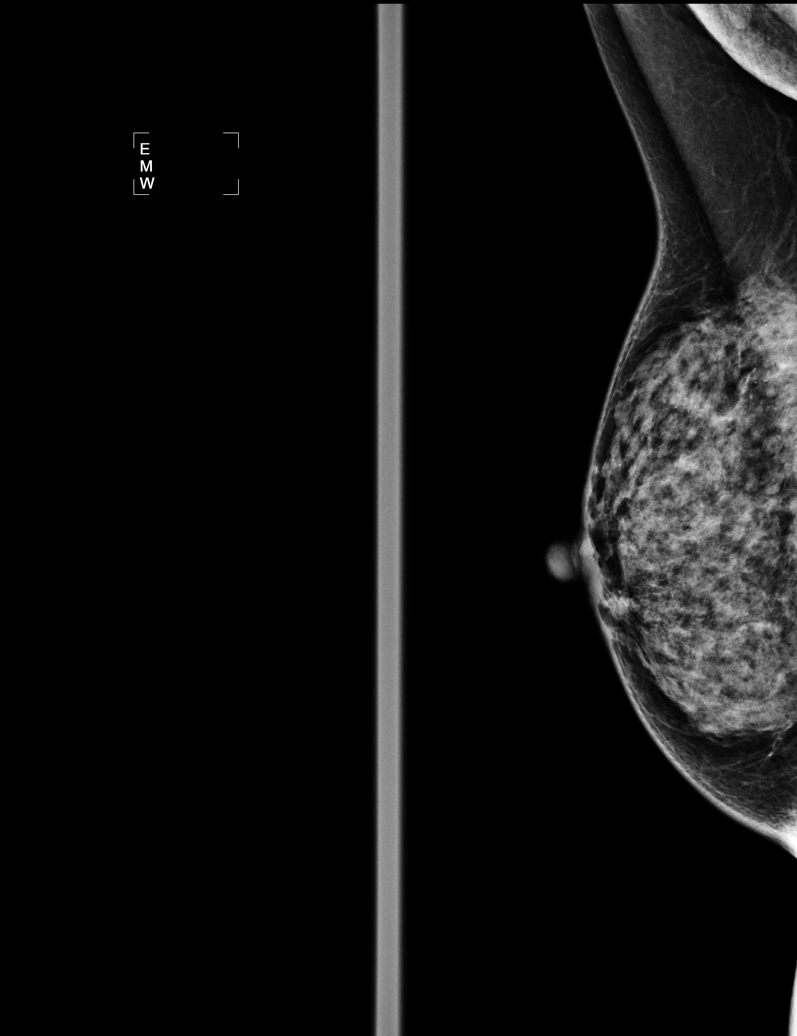

[5 of 5 positions shown; findings below may reference images not displayed]

FINDINGS: The breast tissue is heterogeneously dense.  There is no
suspicious dominant mass, architectural distortion or calcification
to suggest malignancy.
IMPRESSION: No mammographic evidence of malignancy.

A result letter of this screening mammogram will be mailed directly
to the patient.

RECOMMENDATION:
Screening mammogram in one year. (Code:G7-6-JTB)

BI-RADS CATEGORY 1:  Negative

***END ADDENDUM*** SIGNED BY: Irimei Binzar, M.D.
FINDINGS: Prior films are needed for interpretation.
IMPRESSION: Prior exams will be requested for comparison.  Following comparison
with prior studies an addendum will be made regarding further
recommendations.

Images were processed with CAD.

BI-RADS CATEGORY 0:  Incomplete.  Need additional imaging
evaluation and/or prior mammograms for comparison.

## 2016-04-23 ENCOUNTER — Other Ambulatory Visit: Payer: Self-pay | Admitting: *Deleted

## 2016-04-23 DIAGNOSIS — I773 Arterial fibromuscular dysplasia: Secondary | ICD-10-CM

## 2016-04-23 DIAGNOSIS — Z87410 Personal history of cervical dysplasia: Secondary | ICD-10-CM

## 2016-05-14 ENCOUNTER — Encounter: Payer: Self-pay | Admitting: Vascular Surgery

## 2016-05-18 ENCOUNTER — Encounter: Payer: Self-pay | Admitting: Vascular Surgery

## 2016-05-18 ENCOUNTER — Ambulatory Visit (HOSPITAL_COMMUNITY)
Admission: RE | Admit: 2016-05-18 | Discharge: 2016-05-18 | Disposition: A | Discharge status: Home / Self Care | Payer: 59 | Source: Ambulatory Visit | Attending: Vascular Surgery | Admitting: Vascular Surgery

## 2016-05-18 ENCOUNTER — Ambulatory Visit (INDEPENDENT_AMBULATORY_CARE_PROVIDER_SITE_OTHER): Payer: 59 | Admitting: Vascular Surgery

## 2016-05-18 VITALS — BP 130/79 | HR 84 | Temp 97.1°F | Resp 16 | Ht 70.25 in | Wt 161.8 lb

## 2016-05-18 DIAGNOSIS — I714 Abdominal aortic aneurysm, without rupture, unspecified: Secondary | ICD-10-CM

## 2016-05-18 DIAGNOSIS — I773 Arterial fibromuscular dysplasia: Secondary | ICD-10-CM

## 2016-05-18 DIAGNOSIS — I739 Peripheral vascular disease, unspecified: Secondary | ICD-10-CM

## 2016-05-18 DIAGNOSIS — I7771 Dissection of carotid artery: Secondary | ICD-10-CM | POA: Diagnosis not present

## 2016-05-18 LAB — VAS US CAROTID
LCCADSYS: -95 cm/s
LCCAPDIAS: 22 cm/s
LCCAPSYS: 86 cm/s
LEFT ECA DIAS: -11 cm/s
LICADDIAS: -41 cm/s
LICAPDIAS: -28 cm/s
Left CCA dist dias: -32 cm/s
Left ICA dist sys: -97 cm/s
Left ICA prox sys: -65 cm/s
RCCAPDIAS: 19 cm/s
RIGHT CCA MID DIAS: 22 cm/s
RIGHT ECA DIAS: -21 cm/s
Right CCA prox sys: 100 cm/s
Right cca dist sys: -136 cm/s

## 2016-05-18 NOTE — Progress Notes (Signed)
Patient ID: Denise [name], female   DOB: 11/24/1965, 50 y.o.   MRN: [id number]  Reason for Consult: New Evaluation (hx of carotid FMD - needs surgical clearance for R shoulder surgery)   Referred by Sandford Craze'Sullivan, Melissa, NP  Subjective:     HPI:  Denise [name] is a 50 y.o. female mostly healthy patient who is scheduled for arthroscopic right shoulder surgery in the near future with Dr. Leonel RamsayFelder. 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 She was followed for this and never had intervention. She states that she also had evaluation of her renal arteries which were noted to be normal and she denies any renal dysfunction or hypertension. 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 She supposedly had evaluation of her infrarenal abdominal aorta in 2014 which was negative for aneurysm has never had a chest scan which was apparently scheduled at some point in time but never performed. She is completely astigmatic never having had stroke TIA or amaurosis. She has no limitation to her walking ability and her main complaint today is right shoulder pain. She does take aspirin daily.  Past Medical History:  Diagnosis Date  . Arthritis   . Depression   . History of chicken pox   . History of migraine   . Hyperlipidemia   . MVP (mitral valve prolapse)   . Orthostatic hypotension   . Peripheral vascular disease (HCC)   . Phlebitis    Pt reports phlebitis while wearing a leg immobilizer years ago  . SVT (supraventricular tachycardia) (HCC)    Family History  Problem Relation Age of Onset  . Arthritis Mother   . Hyperlipidemia Mother   . Hypertension Mother   . Vitamin D deficiency Mother   . Arthritis Father   . Hyperlipidemia Father   . Hypertension Father   . Thyroid disease Sister   . Vitamin D deficiency Sister   . Hyperlipidemia  Maternal Grandmother   . Hypertension Maternal Grandmother   . Aneurysm Maternal Grandmother     abdmonial  . Hypertension Maternal Grandfather   . Hyperlipidemia Paternal Grandmother   . Hypertension Paternal Grandmother   . Hypertension Paternal Grandfather   . Aneurysm Brother     abdominal   Past Surgical History:  Procedure Laterality Date  . CARDIAC CATHETERIZATION  2006   Performed in [place], no CAD  . HERNIA REPAIR  2004   right inguinal  . KNEE SURGERY     x 4    Short Social History:  Social History  Substance Use Topics  . Smoking status: Never Smoker  . Smokeless tobacco: Never Used  . Alcohol use Yes     Comment: 2 per month    Allergies  Allergen Reactions  . Sulfa Antibiotics Swelling    Current Outpatient Prescriptions  Medication Sig Dispense Refill  . cyclobenzaprine (FLEXERIL) 10 MG tablet Take 10 mg by mouth.    Marland Kitchen. ibuprofen (ADVIL,MOTRIN) 200 MG tablet Take 200 mg by mouth every 6 (six) hours as needed for headache.    . traMADol (ULTRAM) 50 MG tablet Take 50 mg by mouth.    Marland Kitchen. amoxicillin-clavulanate (AUGMENTIN) 875-125 MG per tablet Take 1 tablet by mouth 2 (two) times daily. (Patient not taking: Reported on 05/18/2016) 20 tablet 0  . butalbital-aspirin-caffeine-codeine (FIORINAL WITH CODEINE) 50-325-40-30 MG capsule Take 1  capsule three times daily for 3 days, 1 capsule twice a day for 3 days, 1 capsule daily for 3 days then 1 capsule every other day for 1 week then stop. (Patient not taking: Reported on 05/18/2016) 90 capsule 0  . CALCIUM CITRATE-VITAMIN D PO Take 1 tablet by mouth daily.    . citalopram (CELEXA) 40 MG tablet Take 40 mg by mouth daily.    . diazepam (VALIUM) 5 MG tablet Take 1 tablet (5 mg total) by mouth every 8 (eight) hours as needed for anxiety. (Patient not taking: Reported on 05/18/2016) 90 tablet 0  . Ergocalciferol (VITAMIN D2) 2000 UNITS TABS Take 1 tablet by mouth daily.    . fludrocortisone (FLORINEF) 0.1 MG tablet Take 1  tablet (0.1 mg total) by mouth 2 (two) times daily. (Patient not taking: Reported on 05/18/2016) 60 tablet 2  . Multiple Vitamins-Minerals (CENTRUM ULTRA WOMENS PO) Take 1 tablet by mouth daily.    . vitamin E 400 UNIT capsule Take 800 Units by mouth daily.     No current facility-administered medications for this visit.     Review of Systems  Constitutional:  Constitutional negative. HENT: HENT negative.  Eyes: Eyes negative.  Respiratory: Respiratory negative.  Cardiovascular: Cardiovascular negative.  GI: Gastrointestinal negative.  Musculoskeletal: Positive for joint pain.  Skin: Skin negative.  Neurological: Neurological negative. Hematologic: Hematologic/lymphatic negative.  Psychiatric: Psychiatric negative.        Objective:  Objective   Vitals:   05/18/16 0848  BP: 130/79  Pulse: 84  Resp: 16  Temp: 97.1 F (36.2 C)  TempSrc: Oral  SpO2: 100%  Weight: 161 lb 12.8 oz (73.4 kg)  Height: 5' 10.25" (1.784 m)   Body mass index is 23.05 kg/m.  Physical Exam  Constitutional: She is oriented to person, place, and time. She appears well-developed.  HENT:  Head: Atraumatic.  Eyes: EOM are normal.  Neck: Neck supple.  Cardiovascular: Normal rate.   Pulses:      Radial pulses are 2+ on the right side, and 2+ on the left side.       Femoral pulses are 2+ on the right side, and 2+ on the left side.      Popliteal pulses are 2+ on the right side, and 2+ on the left side.  Abdominal: Soft. She exhibits no mass.  Musculoskeletal: Normal range of motion. She exhibits no edema.  Neurological: She is alert and oriented to person, place, and time.  Skin: Skin is warm and dry.  Psychiatric: She has a normal mood and affect. Her behavior is normal. Judgment and thought content normal.    Data: Doppler velocities suggest 1-39% stenosis of the proximal internal carotid arteries. The distal  internal carotid demonstrates velocities with known FMD.     Assessment/Plan:      Very pleasant 50 year old female with history of FMD in her carotids picked up incidentally on scanning for migraine. [facility] but wanted to move her care closer to home. She is now scheduled for right shoulder surgery she will need to be followed for her history of aneurysmal disease and we will get a AAA duplex, carotid duplex and bilateral lower extremity duplexes in 1 year. We can safely see her sooner should issues arise otherwise plan for testing and repeat evaluation in 1 year. She will continue aspirin.     Maeola HarmanBrandon Christopher Gray Doering MD [facility]

## 2017-05-24 ENCOUNTER — Encounter: Payer: Self-pay | Admitting: Vascular Surgery

## 2017-05-24 ENCOUNTER — Ambulatory Visit (INDEPENDENT_AMBULATORY_CARE_PROVIDER_SITE_OTHER): Payer: 59 | Admitting: Vascular Surgery

## 2017-05-24 ENCOUNTER — Ambulatory Visit (HOSPITAL_COMMUNITY)
Admission: RE | Admit: 2017-05-24 | Discharge: 2017-05-24 | Disposition: A | Discharge status: Home / Self Care | Payer: 59 | Source: Ambulatory Visit | Attending: Vascular Surgery | Admitting: Vascular Surgery

## 2017-05-24 ENCOUNTER — Ambulatory Visit (INDEPENDENT_AMBULATORY_CARE_PROVIDER_SITE_OTHER)
Admission: RE | Admit: 2017-05-24 | Discharge: 2017-05-24 | Disposition: A | Discharge status: Home / Self Care | Payer: 59 | Source: Ambulatory Visit | Attending: Vascular Surgery | Admitting: Vascular Surgery

## 2017-05-24 VITALS — BP 131/84 | HR 73 | Resp 18 | Ht 70.25 in | Wt 152.7 lb

## 2017-05-24 DIAGNOSIS — I714 Abdominal aortic aneurysm, without rupture, unspecified: Secondary | ICD-10-CM

## 2017-05-24 DIAGNOSIS — I773 Arterial fibromuscular dysplasia: Secondary | ICD-10-CM

## 2017-05-24 DIAGNOSIS — I739 Peripheral vascular disease, unspecified: Secondary | ICD-10-CM | POA: Insufficient documentation

## 2017-05-24 DIAGNOSIS — I6523 Occlusion and stenosis of bilateral carotid arteries: Secondary | ICD-10-CM | POA: Diagnosis not present

## 2017-05-24 DIAGNOSIS — Z8679 Personal history of other diseases of the circulatory system: Secondary | ICD-10-CM | POA: Diagnosis not present

## 2017-05-24 DIAGNOSIS — I7771 Dissection of carotid artery: Secondary | ICD-10-CM | POA: Diagnosis not present

## 2017-05-24 LAB — VAS US CAROTID
LCCADDIAS: 33 cm/s
LCCADSYS: 85 cm/s
LCCAPDIAS: 31 cm/s
LCCAPSYS: 99 cm/s
LEFT ECA DIAS: -16 cm/s
LEFT VERTEBRAL DIAS: -22 cm/s
LICADDIAS: -47 cm/s
LICADSYS: -119 cm/s
LICAPSYS: -74 cm/s
Left ICA prox dias: -42 cm/s
RCCADSYS: 138 cm/s
RIGHT CCA MID DIAS: -58 cm/s
RIGHT ECA DIAS: -17 cm/s
RIGHT VERTEBRAL DIAS: -34 cm/s
Right CCA prox dias: -60 cm/s
Right CCA prox sys: -115 cm/s

## 2017-05-24 LAB — VAS US LOWER EXTREMITY ARTERIAL DUPLEX
LEFT PERO DIST SYS: 25 cm/s
LSFDPSV: -90 cm/s
LSFMPSV: -85 cm/s
Left ant tibial distal sys: 55 cm/s
Left super femoral prox sys PSV: -98 cm/s
RIGHT ANT DIST TIBAL SYS PSV: -51 cm/s
RIGHT POST TIB DIST SYS: 50 cm/s
RSFDPSV: -91 cm/s
RSFMPSV: -97 cm/s
RSFPPSV: -112 cm/s
Right peroneal sys PSV: 16 cm/s
left post tibial dist sys: 66 cm/s

## 2017-05-24 NOTE — Progress Notes (Signed)
Patient ID: Denise [name], female   DOB: 09/11/1965, 51 y.o.   MRN: [id number]  Reason for Consult: Follow-up (1 year f/u )   Referred by Wilburn MylarKelly, Samuel S, MD  Subjective:     HPI:  Denise CarolinaSharon D Vanduzer is a 51 y.o. female with history of fibromuscular dysplasia initially seen by me for surgical clearance for shoulder surgery.  She is subsequent undergone the shoulder surgery.  She also has an extensive history of aneurysmal disease.  She presents today with carotid duplex, abdominal aortic duplex and bilateral lower extremity duplexes.  She has no symptoms of stroke or TIA.  She does have an old CT scan which demonstrated no aneurysm.  Her chief complaint is swelling in her bilateral hands and legs.  Her blood pressure is well controlled.  She does not have any issues related to walking.  She is wearing compression stockings.  Past Medical History:  Diagnosis Date  . Arthritis   . Depression   . History of chicken pox   . History of migraine   . Hyperlipidemia   . MVP (mitral valve prolapse)   . Orthostatic hypotension   . Peripheral vascular disease (HCC)   . Phlebitis    Pt reports phlebitis while wearing a leg immobilizer years ago  . SVT (supraventricular tachycardia) (HCC)    Family History  Problem Relation Age of Onset  . Arthritis Mother   . Hyperlipidemia Mother   . Hypertension Mother   . Vitamin D deficiency Mother   . Arthritis Father   . Hyperlipidemia Father   . Hypertension Father   . Thyroid disease Sister   . Vitamin D deficiency Sister   . Hyperlipidemia Maternal Grandmother   . Hypertension Maternal Grandmother   . Aneurysm Maternal Grandmother        abdmonial  . Hypertension Maternal Grandfather   . Hyperlipidemia Paternal Grandmother   . Hypertension Paternal Grandmother   . Hypertension Paternal Grandfather   . Aneurysm Brother        abdominal   Past Surgical History:  Procedure Laterality Date  . CARDIAC CATHETERIZATION  2006   Performed in [place],  no CAD  . HERNIA REPAIR  2004   right inguinal  . KNEE SURGERY     x 4    Short Social History:  Social History   Tobacco Use  . Smoking status: Never Smoker  . Smokeless tobacco: Never Used  Substance Use Topics  . Alcohol use: Yes    Comment: 2 per month    Allergies  Allergen Reactions  . Sulfa Antibiotics Swelling    Current Outpatient Medications  Medication Sig Dispense Refill  . cyclobenzaprine (FLEXERIL) 10 MG tablet Take 10 mg by mouth.    Marland Kitchen. ibuprofen (ADVIL,MOTRIN) 200 MG tablet Take 200 mg by mouth every 6 (six) hours as needed for headache.    . traMADol (ULTRAM) 50 MG tablet Take 50 mg by mouth.    Marland Kitchen. amoxicillin-clavulanate (AUGMENTIN) 875-125 MG per tablet Take 1 tablet by mouth 2 (two) times daily. (Patient not taking: Reported on 05/18/2016) 20 tablet 0  . butalbital-aspirin-caffeine-codeine (FIORINAL WITH CODEINE) 50-325-40-30 MG capsule Take 1 capsule three times daily for 3 days, 1 capsule twice a day for 3 days, 1 capsule daily for 3 days then 1 capsule every other day for 1 week then stop. (Patient not taking: Reported on 05/18/2016) 90 capsule 0  . CALCIUM CITRATE-VITAMIN D PO Take 1 tablet by mouth daily.    .Marland Kitchen  citalopram (CELEXA) 40 MG tablet Take 40 mg by mouth daily.    . diazepam (VALIUM) 5 MG tablet Take 1 tablet (5 mg total) by mouth every 8 (eight) hours as needed for anxiety. (Patient not taking: Reported on 05/18/2016) 90 tablet 0  . Ergocalciferol (VITAMIN D2) 2000 UNITS TABS Take 1 tablet by mouth daily.    . fludrocortisone (FLORINEF) 0.1 MG tablet Take 1 tablet (0.1 mg total) by mouth 2 (two) times daily. (Patient not taking: Reported on 05/18/2016) 60 tablet 2  . Multiple Vitamins-Minerals (CENTRUM ULTRA WOMENS PO) Take 1 tablet by mouth daily.    . vitamin E 400 UNIT capsule Take 800 Units by mouth daily.     No current facility-administered medications for this visit.     Review of Systems  Constitutional:  Constitutional negative. HENT:  HENT negative.  Eyes: Eyes negative.  Respiratory: Respiratory negative.  Cardiovascular: Positive for leg swelling.  Musculoskeletal: Musculoskeletal negative.  Skin: Skin negative.  Neurological: Neurological negative. Hematologic: Hematologic/lymphatic negative.  Psychiatric: Positive for depressed mood.        Objective:  Objective   Vitals:   05/24/17 0923  BP: 131/84  Pulse: 73  Resp: 18  SpO2: 100%  Weight: 152 lb 11.2 oz (69.3 kg)  Height: 5' 10.25" (1.784 m)   Body mass index is 21.75 kg/m.  Physical Exam  Constitutional: She is oriented to person, place, and time. She appears well-developed.  HENT:  Head: Normocephalic.  Eyes: Pupils are equal, round, and reactive to light.  Neck: Normal range of motion. Neck supple.  Cardiovascular: Normal rate.  Pulses:      Radial pulses are 2+ on the right side, and 2+ on the left side.       Popliteal pulses are 2+ on the right side, and 2+ on the left side.  Pulmonary/Chest: Breath sounds normal. No respiratory distress.  Abdominal: Soft. She exhibits no mass.  Musculoskeletal: She exhibits edema.  Neurological: She is alert and oriented to person, place, and time.  Skin: Skin is warm and dry.  Psychiatric: She has a normal mood and affect. Her behavior is normal. Judgment and thought content normal.    Data: I have independently interpreted her bilateral carotid duplexes which demonstrate less than 40% stenosis bilaterally.  I have also independently interpreted her abdominal aortic aneurysm study which demonstrates no aneurysm.  Next  I also independently interpreted her bilateral lower extremity duplexes which demonstrates a right popliteal artery greatest diameter 0.9 cm and left greatest diameter 0.8 cm.  There is no evidence of stenosis.     Assessment/Plan:     51 year old female follows up with fibromuscular dysplasia and extensive aneurysm history.  At this time she has no aneurysm does not need to be  checked again.  Carotid duplex is demonstrate less than 40% stenosis she should continue her aspirin we will see her in 2 years.  She does complain of swelling and is to have venous reflux studies checked as an outpatient.  I have told her we will be happy to help her should she have positive studies.  She demonstrates good understanding we will see her in 2 years.     Maeola HarmanBrandon Christopher Lear Carstens MD [facility]

## 2019-02-10 HISTORY — PX: SHOULDER SURGERY: SHX246

## 2019-09-17 ENCOUNTER — Encounter: Payer: Self-pay | Admitting: Cardiology

## 2019-09-17 ENCOUNTER — Other Ambulatory Visit: Payer: Self-pay

## 2019-09-17 ENCOUNTER — Ambulatory Visit: Payer: 59 | Admitting: Cardiology

## 2019-09-17 VITALS — BP 127/86 | HR 84 | Temp 98.0°F | Resp 18 | Ht 70.0 in | Wt 156.0 lb

## 2019-09-17 DIAGNOSIS — I773 Arterial fibromuscular dysplasia: Secondary | ICD-10-CM

## 2019-09-17 DIAGNOSIS — I493 Ventricular premature depolarization: Secondary | ICD-10-CM | POA: Insufficient documentation

## 2019-09-17 MED ORDER — DILTIAZEM HCL ER COATED BEADS 120 MG PO CP24: 120.0000 mg | ORAL_CAPSULE | Freq: Every day | ORAL | 3 refills | Status: DC

## 2019-09-17 NOTE — Progress Notes (Signed)
Follow up visit  Subjective:   Denise [name], female    DOB: [birth year], 54 y.o.   MRN: [id number]    HPI  Chief Complaint  Patient presents with  . SVT  . Follow-up    54 y.o. Caucasian female with mild to mod mitral regurgitation, symptomatic PVC's, carotid fibromuscular dysplasia.  Patient has noticed more prominent palpitations with activity. In the past, she has opted not to be on beta blocker due to side effects of hypotension. She denies chest pain, shortness of breath, orthopnea, PND, TIA/syncope. She has had leg edema, with known venous insufficiency.    Current Outpatient Medications on File Prior to Visit  Medication Sig Dispense Refill  . aspirin 81 MG EC tablet Take 1 tablet by mouth daily.    Marland Kitchen atorvastatin (LIPITOR) 10 MG tablet Take 1 tablet by mouth daily.    . cyclobenzaprine (FLEXERIL) 10 MG tablet Take 10 mg by mouth.    . Ergocalciferol (VITAMIN D2) 2000 UNITS TABS Take 1 tablet by mouth daily.    Marland Kitchen ibuprofen (ADVIL,MOTRIN) 200 MG tablet Take 200 mg by mouth every 6 (six) hours as needed for headache.    . SUMAtriptan (IMITREX) 100 MG tablet Take by mouth.    . traMADol (ULTRAM) 50 MG tablet Take 50 mg by mouth.    . butalbital-aspirin-caffeine-codeine (FIORINAL WITH CODEINE) 50-325-40-30 MG capsule Take 1 capsule three times daily for 3 days, 1 capsule twice a day for 3 days, 1 capsule daily for 3 days then 1 capsule every other day for 1 week then stop. (Patient not taking: Reported on 05/18/2016) 90 capsule 0  . CALCIUM CITRATE-VITAMIN D PO Take 1 tablet by mouth daily.    . citalopram (CELEXA) 40 MG tablet Take 40 mg by mouth daily.    . diazepam (VALIUM) 5 MG tablet Take 1 tablet (5 mg total) by mouth every 8 (eight) hours as needed for anxiety. (Patient not taking: Reported on 05/18/2016) 90 tablet 0  . fludrocortisone (FLORINEF) 0.1 MG tablet Take 1 tablet (0.1 mg total) by mouth 2 (two) times daily. (Patient not taking: Reported on 05/18/2016) 60 tablet 2    . Multiple Vitamins-Minerals (CENTRUM ULTRA WOMENS PO) Take 1 tablet by mouth daily.    . vitamin E 400 UNIT capsule Take 800 Units by mouth daily.     No current facility-administered medications on file prior to visit.    Cardiovascular & other pertient studies:  EKG 09/17/2019: Sinus rhythm 80 bpm. Normal EKG.  Event monitor 05/17/2027 - 06/20/2027: Diagnostic time: 40% This limits diagnostic sensitivity Dominant rhythm: Sinus. HR 68-158 bpm. Avg HR 90 bpm. Occasional PVC, one run of atrial tachycardia, correlates with symptoms. No atrial fibrillation/atrial flutter/SVT/VT/high grade AV block, sinus pause >3sec noted.  Echocardiogram 05/14/2018: 1. Study is technically limited due to patient's body habitus. 2. Left ventricle cavity is normal in size. Normal global wall motion. Calculated EF 62%. 3. Mild to moderate mitral regurgitation. Mitral leaflets are not clearly visualized on left parasternal long axis view due to technical limitation. Thus, can't comment about mitral valve prolapse. 4. Mild tricuspid regurgitation. No evidence of pulmonary hypertension. 5. No significant change in severity of MR c.f. echo. of 04/29/2017.  Carotid US 2018: <40% b/l IC stenoses with increased diastolic velocity, with known fibromuscular dysplasia. Increased diastolic velocity in b/l mid to distal IC, c/w known fibromuscular dysplasia.   Recent labs: No recent labs available   Review of Systems  Cardiovascular: Positive for palpitations. Negative  for chest pain, dyspnea on exertion, leg swelling and syncope.         Vitals:   09/17/19 1032  BP: 127/86  Pulse: 84  Resp: 18  Temp: 98 F (36.7 C)  SpO2: 100%    Body mass index is 22.38 kg/m. Filed Weights   09/17/19 1032  Weight: 156 lb (70.8 kg)     Objective:   Physical Exam  Constitutional: She appears well-developed and well-nourished.  Neck: No JVD present.  Cardiovascular: Normal rate, regular rhythm and  intact distal pulses.  Murmur heard. High-pitched blowing holosystolic murmur is present with a grade of 2/6 at the apex. Varicose veins, trace edema b/l  Pulmonary/Chest: Effort normal and breath sounds normal. She has no wheezes. She has no rales.  Musculoskeletal:        General: No edema.  Nursing note and vitals reviewed.         Assessment & Recommendations:   54 y.o. Caucasian female with mild to mod mitral regurgitation, symptomatic PVC's, carotid fibromuscular dysplasia.  Symptomatic PVC's: She would like to avoid wearing monitor, if possible, due to concerns with reaction to adhesive strips. Will empirically treat with diltiazem 120 mg daily. She has had hypotension with metoprolol in the past.   Mitral regurgitation: Will check echocardiogram.  FMD of carotid arteries: Will check carotid US. Conintue Aspirin.   I will see her back in 2 years after repeat echocardiogram.    Elder Negus, MD [facility] Pager: [phone] Office: [phone]

## 2019-09-29 ENCOUNTER — Ambulatory Visit: Payer: 59

## 2019-09-29 ENCOUNTER — Other Ambulatory Visit: Payer: Self-pay

## 2019-09-29 DIAGNOSIS — I773 Arterial fibromuscular dysplasia: Secondary | ICD-10-CM

## 2019-09-29 DIAGNOSIS — I493 Ventricular premature depolarization: Secondary | ICD-10-CM

## 2019-10-07 NOTE — Progress Notes (Signed)
Spoke with patient. Patient voiced understanding.

## 2019-11-04 NOTE — Progress Notes (Deleted)
Follow up visit  Subjective:   Denise [name], female    DOB: 1965/12/06, 54 y.o.   MRN: [id number]    HPI  No chief complaint on file.   54 y.o. Caucasian female with mild to mod mitral regurgitation, symptomatic PVC's, carotid fibromuscular dysplasia, here for follow up of symptomatic PVCs.  ***At last visit in 09/2019,   ***  ***Previous OV on 09/17/2019: Patient has noticed more prominent palpitations with activity. In the past, she has opted not to be on beta blocker due to side effects of hypotension. She denies chest pain, shortness of breath, orthopnea, PND, TIA/syncope. She has had leg edema, with known venous insufficiency.    *** Current Outpatient Medications on File Prior to Visit  Medication Sig Dispense Refill  . aspirin 81 MG EC tablet Take 1 tablet by mouth daily.    Marland Kitchen atorvastatin (LIPITOR) 10 MG tablet Take 1 tablet by mouth daily.    . butalbital-aspirin-caffeine-codeine (FIORINAL WITH CODEINE) 50-325-40-30 MG capsule Take 1 capsule three times daily for 3 days, 1 capsule twice a day for 3 days, 1 capsule daily for 3 days then 1 capsule every other day for 1 week then stop. (Patient not taking: Reported on 05/18/2016) 90 capsule 0  . CALCIUM CITRATE-VITAMIN D PO Take 1 tablet by mouth daily.    . citalopram (CELEXA) 40 MG tablet Take 40 mg by mouth daily.    . cyclobenzaprine (FLEXERIL) 10 MG tablet Take 10 mg by mouth.    . diazepam (VALIUM) 5 MG tablet Take 1 tablet (5 mg total) by mouth every 8 (eight) hours as needed for anxiety. (Patient not taking: Reported on 05/18/2016) 90 tablet 0  . diltiazem (CARDIZEM CD) 120 MG 24 hr capsule Take 1 capsule (120 mg total) by mouth daily. 60 capsule 3  . Ergocalciferol (VITAMIN D2) 2000 UNITS TABS Take 1 tablet by mouth daily.    . fludrocortisone (FLORINEF) 0.1 MG tablet Take 1 tablet (0.1 mg total) by mouth 2 (two) times daily. (Patient not taking: Reported on 05/18/2016) 60 tablet 2  . ibuprofen (ADVIL,MOTRIN) 200 MG  tablet Take 200 mg by mouth every 6 (six) hours as needed for headache.    . Multiple Vitamins-Minerals (CENTRUM ULTRA WOMENS PO) Take 1 tablet by mouth daily.    . SUMAtriptan (IMITREX) 100 MG tablet Take by mouth.    . traMADol (ULTRAM) 50 MG tablet Take 50 mg by mouth.    . vitamin E 400 UNIT capsule Take 800 Units by mouth daily.     No current facility-administered medications on file prior to visit.    Cardiovascular & other pertient studies: *** EKG ***/***/202***: ***  EKG 09/17/2019: Sinus rhythm 80 bpm. Normal EKG.  ***Echocardiogram 09/29/2019:  Hyperdynamic LV systolic function with visual EF >70%. Left ventricle cavity is normal in size. Moderate concentric hypertrophy of the left ventricle.  Normal global wall motion. Normal diastolic filling pattern, normal LAP. Calculated EF 62%.  Mild (Grade I) mitral regurgitation.  Mild tricuspid regurgitation. No evidence of pulmonary hypertension.  No significant change compared to prior study dated 05/14/2018.  ***Carotid artery duplex 09/29/2019:  No significant plaque burden seen. Elevated velocity in bilateral distal ICA suggests minimal stenosis. 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 Overall no significant change in velocity compared to 05/24/2017.  Antegrade right vertebral artery flow. Antegrade left vertebral artery flow.  Follow up studies if clinically indicated.   Event monitor 05/17/2027 - 06/20/2027: Diagnostic time:  40% This limits diagnostic sensitivity Dominant rhythm: Sinus. HR 68-158 bpm. Avg HR 90 bpm. Occasional PVC, one run of atrial tachycardia, correlates with symptoms. No atrial fibrillation/atrial flutter/SVT/VT/high grade AV block, sinus pause >3sec noted.  Echocardiogram 05/14/2018: 1. Study is technically limited due to patient's body habitus. 2. Left ventricle cavity is normal in size. Normal global wall motion. Calculated EF 62%. 3. Mild to moderate mitral regurgitation. Mitral  leaflets are not clearly visualized on left parasternal long axis view due to technical limitation. Thus, can't comment about mitral valve prolapse. 4. Mild tricuspid regurgitation. No evidence of pulmonary hypertension. 5. No significant change in severity of MR c.f. echo. Of 04/29/2017.  Carotid US 2018: <40% b/l IC stenoses with increased diastolic velocity, with known fibromuscular dysplasia. Increased diastolic velocity in b/l mid to distal IC, c/w known fibromuscular dysplasia.   Recent labs: No recent labs available   Review of Systems  Cardiovascular: Positive for palpitations. Negative for chest pain, dyspnea on exertion, leg swelling and syncope.         There were no vitals filed for this visit.  There is no height or weight on file to calculate BMI. There were no vitals filed for this visit.   Objective:   Physical Exam  Constitutional: She appears well-developed and well-nourished.  Neck: No JVD present.  Cardiovascular: Normal rate, regular rhythm and intact distal pulses.  Murmur heard. High-pitched blowing holosystolic murmur is present with a grade of 2/6 at the apex. Varicose veins, trace edema b/l  Pulmonary/Chest: Effort normal and breath sounds normal. She has no wheezes. She has no rales.  Musculoskeletal:        General: No edema.  Nursing note and vitals reviewed.         Assessment & Recommendations:   54 y.o. Caucasian female with mild to mod mitral regurgitation, symptomatic PVC's, carotid fibromuscular dysplasia.  ***  Follow up in ***  Symptomatic PVC's: She would like to avoid wearing monitor, if possible, due to concerns with reaction to adhesive strips. Will empirically treat with diltiazem 120 mg daily. She has had hypotension with metoprolol in the past.   Mitral regurgitation: Will check echocardiogram.  FMD of carotid arteries: Will check carotid US. Continue Aspirin.   *** I will see her back in 2 years after repeat  echocardiogram.    Nigel Mormon, MD [facility] [place] Pager: [phone] Office: [phone]

## 2019-11-05 ENCOUNTER — Ambulatory Visit: Payer: 59 | Admitting: Cardiology

## 2019-11-06 ENCOUNTER — Ambulatory Visit: Payer: 59 | Admitting: Cardiology

## 2019-11-06 ENCOUNTER — Other Ambulatory Visit: Payer: Self-pay

## 2019-11-06 ENCOUNTER — Encounter: Payer: Self-pay | Admitting: Cardiology

## 2019-11-06 VITALS — BP 129/77 | HR 76 | Ht 71.0 in | Wt 154.4 lb

## 2019-11-06 DIAGNOSIS — I340 Nonrheumatic mitral (valve) insufficiency: Secondary | ICD-10-CM

## 2019-11-06 DIAGNOSIS — I773 Arterial fibromuscular dysplasia: Secondary | ICD-10-CM

## 2019-11-06 DIAGNOSIS — I493 Ventricular premature depolarization: Secondary | ICD-10-CM

## 2019-11-06 MED ORDER — DILTIAZEM HCL ER COATED BEADS 120 MG PO CP24: 120.0000 mg | ORAL_CAPSULE | Freq: Every day | ORAL | 3 refills | Status: DC

## 2019-11-06 NOTE — Progress Notes (Signed)
Follow up visit  Subjective:   Denise [name], female    DOB: 03-May-1966, 54 y.o.   MRN: [id number]   HPI   Chief Complaint  Patient presents with  . PVC's  . Follow-up    54 y.o. Caucasian female with mild to mod mitral regurgitation, symptomatic PVC's, carotid fibromuscular dysplasia.  Palpitations have improved on diltiazem 120 mg daily. Recent echocardiogram and carotid US discussed with the patient.    Current Outpatient Medications on File Prior to Visit  Medication Sig Dispense Refill  . aspirin 81 MG EC tablet Take 1 tablet by mouth daily.    Marland Kitchen atorvastatin (LIPITOR) 10 MG tablet Take 1 tablet by mouth daily.    . butalbital-aspirin-caffeine-codeine (FIORINAL WITH CODEINE) 50-325-40-30 MG capsule Take 1 capsule three times daily for 3 days, 1 capsule twice a day for 3 days, 1 capsule daily for 3 days then 1 capsule every other day for 1 week then stop. (Patient not taking: Reported on 05/18/2016) 90 capsule 0  . CALCIUM CITRATE-VITAMIN D PO Take 1 tablet by mouth daily.    . citalopram (CELEXA) 40 MG tablet Take 40 mg by mouth daily.    . cyclobenzaprine (FLEXERIL) 10 MG tablet Take 10 mg by mouth.    . diazepam (VALIUM) 5 MG tablet Take 1 tablet (5 mg total) by mouth every 8 (eight) hours as needed for anxiety. (Patient not taking: Reported on 05/18/2016) 90 tablet 0  . diltiazem (CARDIZEM CD) 120 MG 24 hr capsule Take 1 capsule (120 mg total) by mouth daily. 60 capsule 3  . Ergocalciferol (VITAMIN D2) 2000 UNITS TABS Take 1 tablet by mouth daily.    . fludrocortisone (FLORINEF) 0.1 MG tablet Take 1 tablet (0.1 mg total) by mouth 2 (two) times daily. (Patient not taking: Reported on 05/18/2016) 60 tablet 2  . ibuprofen (ADVIL,MOTRIN) 200 MG tablet Take 200 mg by mouth every 6 (six) hours as needed for headache.    . Multiple Vitamins-Minerals (CENTRUM ULTRA WOMENS PO) Take 1 tablet by mouth daily.    . SUMAtriptan (IMITREX) 100 MG tablet Take by mouth.    . traMADol (ULTRAM)  50 MG tablet Take 50 mg by mouth.    . vitamin E 400 UNIT capsule Take 800 Units by mouth daily.     No current facility-administered medications on file prior to visit.    Cardiovascular & other pertient studies:  Carotid artery duplex 09/29/2019:  No significant plaque burden seen. Elevated velocity in bilateral distal  ICA suggests minimal stenosis. 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 Overall no significant change in velocity compared to  05/24/2017.  Antegrade right vertebral artery flow. Antegrade left vertebral artery  flow.  Follow up studies if clinically indicated.   Echocardiogram 09/29/2019:  Hyperdynamic LV systolic function with visual EF >70%. Left ventricle  cavity is normal in size. Moderate concentric hypertrophy of the left  ventricle. Normal global wall motion. Normal diastolic filling pattern,  normal LAP. Calculated EF 62%.  Mild (Grade I) mitral regurgitation.  Mild tricuspid regurgitation. No evidence of pulmonary hypertension.  No significant change compared to prior study dated 05/14/2018.  EKG 09/17/2019: Sinus rhythm 80 bpm. Normal EKG.  Event monitor 05/17/2027 - 06/20/2027: Diagnostic time: 40% This limits diagnostic sensitivity Dominant rhythm: Sinus. HR 68-158 bpm. Avg HR 90 bpm. Occasional PVC, one run of atrial tachycardia, correlates with symptoms. No atrial fibrillation/atrial flutter/SVT/VT/high grade AV block, sinus pause >3sec noted.  Carotid US 2018: <  40% b/l IC stenoses with increased diastolic velocity, with known fibromuscular dysplasia. Increased diastolic velocity in b/l mid to distal IC, c/w known fibromuscular dysplasia.   Recent labs: No recent labs available   Review of Systems  Cardiovascular: Positive for palpitations. Negative for chest pain, dyspnea on exertion, leg swelling and syncope.       Vitals:   11/06/19 1142  BP: 129/77  Pulse: 76  SpO2: 100%     Body mass index is 21.53  kg/m. Filed Weights   11/06/19 1142  Weight: 154 lb 6.4 oz (70 kg)     Objective:   Physical Exam  Constitutional: She appears well-developed and well-nourished.  Neck: No JVD present.  Cardiovascular: Normal rate, regular rhythm and intact distal pulses.  Murmur heard. High-pitched blowing holosystolic murmur is present with a grade of 2/6 at the apex. Varicose veins, trace edema b/l  Pulmonary/Chest: Effort normal and breath sounds normal. She has no wheezes. She has no rales.  Musculoskeletal:        General: No edema.  Nursing note and vitals reviewed.         Assessment & Recommendations:   54 y.o. Caucasian female with mild to mod mitral regurgitation, symptomatic PVC's, carotid fibromuscular dysplasia.  Symptomatic PVC's: Continue diltiazem 120 mg daily.  Mitral regurgitation: Mild, stable.  FMD of carotid arteries: Stable. Conintue Aspirin.   F/u in 1 year  Denise Mormon, MD [facility] [place] Pager: [phone] Office: [phone]

## 2019-11-08 ENCOUNTER — Encounter: Payer: Self-pay | Admitting: Cardiology

## 2019-11-08 DIAGNOSIS — I340 Nonrheumatic mitral (valve) insufficiency: Secondary | ICD-10-CM | POA: Insufficient documentation

## 2020-11-09 ENCOUNTER — Ambulatory Visit: Payer: 59 | Admitting: Cardiology

## 2020-11-11 ENCOUNTER — Other Ambulatory Visit: Payer: Self-pay | Admitting: Cardiology

## 2020-11-11 DIAGNOSIS — I493 Ventricular premature depolarization: Secondary | ICD-10-CM

## 2020-12-14 ENCOUNTER — Ambulatory Visit: Payer: BC Managed Care – PPO | Admitting: Cardiology

## 2020-12-29 ENCOUNTER — Encounter: Payer: Self-pay | Admitting: Cardiology

## 2020-12-29 ENCOUNTER — Other Ambulatory Visit: Payer: Self-pay

## 2020-12-29 ENCOUNTER — Ambulatory Visit: Payer: BC Managed Care – PPO | Admitting: Cardiology

## 2020-12-29 VITALS — BP 121/73 | HR 65 | Temp 98.0°F | Resp 16 | Ht 64.0 in | Wt 143.0 lb

## 2020-12-29 DIAGNOSIS — I493 Ventricular premature depolarization: Secondary | ICD-10-CM

## 2020-12-29 DIAGNOSIS — I773 Arterial fibromuscular dysplasia: Secondary | ICD-10-CM

## 2020-12-29 MED ORDER — DILTIAZEM HCL ER COATED BEADS 120 MG PO CP24: ORAL_CAPSULE | ORAL | 3 refills | Status: DC

## 2020-12-29 NOTE — Progress Notes (Signed)
Follow up visit  Subjective:   Denise [name], female    DOB: Feb 25, 1966, 55 y.o.   MRN: [id number]   HPI   Chief Complaint  Patient presents with   Arterial fibromuscular dysplasia    Symptomatic PVCs   Follow-up    1 Year    55 y.o. Caucasian female with mild to mod mitral regurgitation, symptomatic PVC's, carotid fibromuscular dysplasia.  Patient lost her mother last week.  Since then, she has noted some increase in her palpitations.  She denies any associated chest pain, shortness of breath, syncope symptoms.    Reviewed recent lab results with the patient, details below.  Current Outpatient Medications on File Prior to Visit  Medication Sig Dispense Refill   aspirin 81 MG EC tablet Take 1 tablet by mouth daily.     cyclobenzaprine (FLEXERIL) 10 MG tablet Take 10 mg by mouth.     diltiazem (CARDIZEM CD) 120 MG 24 hr capsule TAKE 1 CAPSULE(120 MG) BY MOUTH DAILY 90 capsule 3   Ergocalciferol (VITAMIN D2) 2000 UNITS TABS Take 1 tablet by mouth daily.     ibuprofen (ADVIL,MOTRIN) 200 MG tablet Take 200 mg by mouth every 6 (six) hours as needed for headache.     traMADol (ULTRAM) 50 MG tablet Take 50 mg by mouth.     rosuvastatin (CRESTOR) 5 MG tablet Take 5 mg by mouth daily.     No current facility-administered medications on file prior to visit.    Cardiovascular & other pertient studies:  Carotid artery duplex  Oct 05, 2019:  No significant plaque burden seen. Elevated velocity in bilateral distal  ICA suggests minimal stenosis. 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 Overall no significant change in velocity compared to  05/24/2017.  Antegrade right vertebral artery flow. Antegrade left vertebral artery  flow.  Follow up studies if clinically indicated.   Echocardiogram Oct 05, 2019:  Hyperdynamic LV systolic function with visual EF >70%. Left ventricle  cavity is normal in size. Moderate concentric hypertrophy of the left  ventricle. Normal  global wall motion. Normal diastolic filling pattern,  normal LAP. Calculated EF 62%.  Mild (Grade I) mitral regurgitation.  Mild tricuspid regurgitation. No evidence of pulmonary hypertension.  No significant change compared to prior study dated 05/14/2018.  EKG 09/17/2019: Sinus rhythm 80 bpm. Normal EKG.  Event monitor 05/17/2027 - 06/20/2027: Diagnostic time: 40% This limits diagnostic sensitivity Dominant rhythm: Sinus. HR 68-158 bpm. Avg HR 90 bpm. Occasional PVC, one run of atrial tachycardia, correlates with symptoms. No atrial fibrillation/atrial flutter/SVT/VT/high grade AV block, sinus pause >3sec noted.  Carotid US 2018: <40% b/l IC stenoses with increased diastolic velocity, with known fibromuscular dysplasia. Increased diastolic velocity in b/l mid to distal IC, c/w known fibromuscular dysplasia.   Recent labs: 07/29/2020: Glucose 91, BUN/Cr 14/0.87. EGFR 78. Na/K 139/4.0. Rest of the CMP normal H/H 12/38. MCV 89. Platelets 343 HbA1C N/A Chol 216, TG 71, HDL 90, LDL 112 TSH 1.1 normal    Review of Systems  Cardiovascular:  Positive for palpitations. Negative for chest pain, dyspnea on exertion, leg swelling and syncope.      Vitals:   12/29/20 1135  BP: 121/73  Pulse: 65  Resp: 16  Temp: 98 F (36.7 C)  SpO2: 98%     Body mass index is 24.55 kg/m. Filed Weights   12/29/20 1135  Weight: 143 lb (64.9 kg)     Objective:   Physical Exam Vitals and nursing note reviewed.  Constitutional:  General: She is not in acute distress. Neck:     Vascular: No JVD.  Cardiovascular:     Rate and Rhythm: Normal rate and regular rhythm.     Heart sounds: Normal heart sounds. No murmur heard. Pulmonary:     Effort: Pulmonary effort is normal.     Breath sounds: Normal breath sounds. No wheezing or rales.  Musculoskeletal:     Right lower leg: No edema.     Left lower leg: No edema.    No significant change compared to previous exam.      Assessment &  Recommendations:   56 y.o. Caucasian female with mild to mod mitral regurgitation, symptomatic PVC's, carotid fibromuscular dysplasia.  Symptomatic PVC's: Continue diltiazem 120 mg daily.  Mitral regurgitation: Mild, stable.  FMD of carotid arteries: Stable. Conintue Aspirin.   F/u in 1 year  Nigel Mormon, MD [facility] [place] Pager: [phone] Office: [phone]

## 2021-11-08 ENCOUNTER — Other Ambulatory Visit: Payer: Self-pay

## 2021-11-08 DIAGNOSIS — I493 Ventricular premature depolarization: Secondary | ICD-10-CM

## 2021-11-08 MED ORDER — DILTIAZEM HCL ER COATED BEADS 120 MG PO CP24: ORAL_CAPSULE | ORAL | 3 refills | Status: DC

## 2021-12-29 ENCOUNTER — Ambulatory Visit: Payer: BC Managed Care – PPO | Admitting: Cardiology

## 2022-08-24 ENCOUNTER — Ambulatory Visit: Payer: 59 | Admitting: Cardiology

## 2022-08-24 ENCOUNTER — Encounter: Payer: Self-pay | Admitting: Cardiology

## 2022-08-24 VITALS — BP 125/78 | HR 83 | Ht 71.0 in | Wt 144.0 lb

## 2022-08-24 DIAGNOSIS — I773 Arterial fibromuscular dysplasia: Secondary | ICD-10-CM

## 2022-08-24 DIAGNOSIS — I493 Ventricular premature depolarization: Secondary | ICD-10-CM

## 2022-08-24 NOTE — Progress Notes (Signed)
Follow up visit  Subjective:   Denise [name], female    DOB: 15-Mar-1966, 57 y.o.   MRN: [id number]   HPI   Chief Complaint  Patient presents with   Symptomatic PVCs   Follow-up    57 y.o. Caucasian female with mild to mod mitral regurgitation, symptomatic PVC's, carotid fibromuscular dysplasia.  Last year has been difficult with her father's health. She is resuming routine physical activity Occasional palpitations without chest pain.    Current Outpatient Medications:    aspirin 81 MG EC tablet, Take 1 tablet by mouth daily., Disp: , Rfl:    cyclobenzaprine (FLEXERIL) 10 MG tablet, Take 10 mg by mouth., Disp: , Rfl:    diltiazem (CARDIZEM CD) 120 MG 24 hr capsule, TAKE 1 CAPSULE(120 MG) BY MOUTH DAILY, Disp: 90 capsule, Rfl: 3   Ergocalciferol (VITAMIN D2) 2000 UNITS TABS, Take 1 tablet by mouth daily., Disp: , Rfl:    ibuprofen (ADVIL,MOTRIN) 200 MG tablet, Take 200 mg by mouth every 6 (six) hours as needed for headache., Disp: , Rfl:    rosuvastatin (CRESTOR) 5 MG tablet, Take 5 mg by mouth daily., Disp: , Rfl:    traMADol (ULTRAM) 50 MG tablet, Take 50 mg by mouth., Disp: , Rfl:   Cardiovascular & other pertient studies:  EKG 08/24/2022: Sinus rhythm 80 bpm  Left atrial enlargement  Carotid artery duplex  09/29/2019:  No significant plaque burden seen. Elevated velocity in bilateral distal  ICA suggests minimal stenosis. 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 Overall no significant change in velocity compared to  05/24/2017.  Antegrade right vertebral artery flow. Antegrade left vertebral artery  flow.  Follow up studies if clinically indicated.   Echocardiogram 09/29/2019:  Hyperdynamic LV systolic function with visual EF >70%. Left ventricle  cavity is normal in size. Moderate concentric hypertrophy of the left  ventricle. Normal global wall motion. Normal diastolic filling pattern,  normal LAP. Calculated EF 62%.  Mild (Grade I) mitral  regurgitation.  Mild tricuspid regurgitation. No evidence of pulmonary hypertension.  No significant change compared to prior study dated 05/14/2018.  Event monitor 05/17/2027 - 06/20/2027: Diagnostic time: 40% This limits diagnostic sensitivity Dominant rhythm: Sinus. HR 68-158 bpm. Avg HR 90 bpm. Occasional PVC, one run of atrial tachycardia, correlates with symptoms. No atrial fibrillation/atrial flutter/SVT/VT/high grade AV block, sinus pause >3sec noted.  Carotid US 2018: <40% b/l IC stenoses with increased diastolic velocity, with known fibromuscular dysplasia. Increased diastolic velocity in b/l mid to distal IC, c/w known fibromuscular dysplasia.   Recent labs: 07/29/2020: Glucose 91, BUN/Cr 14/0.87. EGFR 78. Na/K 139/4.0. Rest of the CMP normal H/H 12/38. MCV 89. Platelets 343 HbA1C N/A Chol 216, TG 71, HDL 90, LDL 112 TSH 1.1 normal    Review of Systems  Cardiovascular:  Positive for palpitations. Negative for chest pain, dyspnea on exertion, leg swelling and syncope.       Vitals:   08/24/22 1314  BP: 125/78  Pulse: 83  SpO2: 99%     Body mass index is 24.72 kg/m. Filed Weights   08/24/22 1314  Weight: 144 lb (65.3 kg)     Objective:   Physical Exam Vitals and nursing note reviewed.  Constitutional:      General: She is not in acute distress. Neck:     Vascular: No JVD.  Cardiovascular:     Rate and Rhythm: Normal rate and regular rhythm.     Pulses:  Carotid pulses are  on the left side with bruit.    Heart sounds: Normal heart sounds. No murmur heard. Pulmonary:     Effort: Pulmonary effort is normal.     Breath sounds: Normal breath sounds. No wheezing or rales.  Musculoskeletal:     Right lower leg: No edema.     Left lower leg: No edema.        Assessment & Recommendations:   57 y.o. Caucasian female with mild to mod mitral regurgitation, symptomatic PVC's, carotid fibromuscular dysplasia.  Symptomatic PVC's: Continue  diltiazem 120 mg daily. If symptoms not imrpved after increasing physical activity, will report cardaic telemetry.   Mitral regurgitation: Mild, stable. Check echocardiogram.  FMD of carotid arteries: Stable. Conintue Aspirin. Will check carotid duplex.    F/u in 1 year   Nigel Mormon, MD Pager: [phone] Office: [phone]

## 2022-09-03 ENCOUNTER — Other Ambulatory Visit: Payer: 59

## 2022-10-02 ENCOUNTER — Ambulatory Visit: Payer: 59

## 2022-10-02 DIAGNOSIS — I493 Ventricular premature depolarization: Secondary | ICD-10-CM

## 2022-10-02 DIAGNOSIS — I773 Arterial fibromuscular dysplasia: Secondary | ICD-10-CM

## 2022-10-22 ENCOUNTER — Other Ambulatory Visit: Payer: Self-pay | Admitting: Cardiology

## 2022-10-22 DIAGNOSIS — I493 Ventricular premature depolarization: Secondary | ICD-10-CM

## 2023-02-21 ENCOUNTER — Ambulatory Visit: Payer: 59 | Admitting: Cardiology

## 2023-02-25 ENCOUNTER — Ambulatory Visit: Payer: 59 | Admitting: Cardiology

## 2023-03-18 ENCOUNTER — Ambulatory Visit: Payer: 59 | Attending: Cardiology | Admitting: Cardiology

## 2023-03-18 ENCOUNTER — Encounter: Payer: Self-pay | Admitting: Cardiology

## 2023-03-18 VITALS — BP 108/76 | HR 69 | Resp 16 | Ht 71.0 in | Wt 144.2 lb

## 2023-03-18 DIAGNOSIS — I493 Ventricular premature depolarization: Secondary | ICD-10-CM | POA: Diagnosis not present

## 2023-03-18 DIAGNOSIS — I340 Nonrheumatic mitral (valve) insufficiency: Secondary | ICD-10-CM

## 2023-03-18 MED ORDER — ASPIRIN 81 MG PO TBEC: 81.0000 mg | DELAYED_RELEASE_TABLET | Freq: Every day | ORAL | 3 refills | Status: DC

## 2023-03-18 NOTE — Progress Notes (Signed)
Cardiology Office Note:  .   Date:  03/18/2023  ID:  DELLE [name], DOB August 19, 1965, MRN [id number] PCP: Kallie Locks, FNP  [facility] Providers Cardiologist:  Truett Mainland, MD PCP: Kallie Locks, FNP  Chief Complaint  Patient presents with   Symptomatic PVCs   Follow-up      History of Present Illness: .    Denise [name] is a 57 y.o. female with mild to mod mitral regurgitation, symptomatic PVC's, carotid fibromuscular dysplasia.  Patient had COVID a month ago, and is still recovering.  She has Patient from time to time, continued from before.  She gets very lightheaded when working outside in the heat, but does not have the symptoms otherwise.  She denies any significant chest pain or dyspnea symptoms.   Vitals:   03/18/23 1615  BP: 108/76  Pulse: 69  Resp: 16  SpO2: 98%     ROS:  Review of Systems  Cardiovascular:  Negative for chest pain, dyspnea on exertion, leg swelling, palpitations and syncope.     Studies Reviewed: .       Echocardiogram 10/02/2022:  Normal LV systolic function with visual EF 60-65%. Left ventricle cavity  is normal in size. Normal left ventricular wall thickness. Normal global  wall motion. Normal diastolic filling pattern, normal LAP. Calculated EF  65%.  Mild (Grade I) mitral regurgitation. Mild calcification of the mitral  valve annulus.  Structurally normal tricuspid valve with trace regurgitation. No evidence  of pulmonary hypertension.  No significant change compared to 09/2019.   Carotid artery duplex 10/02/2022: Duplex suggests stenosis in the right internal carotid artery (minimal). Duplex suggests stenosis in the left internal carotid artery (minimal). Antegrade right vertebral artery flow. Antegrade left vertebral artery flow. There is no significant plaque burden bilaterally.  Compared to the study done on 09/29/2019, no significant change.  Mild elevation in bilateral distal ICA suggestive of FMD not  documented on present study.  Follow-up studies if clinically indicated.    Physical Exam:   Physical Exam Vitals and nursing note reviewed.  Constitutional:      General: She is not in acute distress. Neck:     Vascular: No JVD.  Cardiovascular:     Rate and Rhythm: Normal rate and regular rhythm.     Heart sounds: Normal heart sounds. No murmur heard. Pulmonary:     Effort: Pulmonary effort is normal.     Breath sounds: Normal breath sounds. No wheezing or rales.  Musculoskeletal:     Right lower leg: No edema.     Left lower leg: No edema.      VISIT DIAGNOSES:   ICD-10-CM   1. Symptomatic PVCs  I49.3     2. Nonrheumatic mitral valve regurgitation  I34.0        ASSESSMENT AND PLAN: .    Denise [name] is a 57 y.o. female with mild to mod mitral regurgitation, symptomatic PVC's, carotid fibromuscular dysplasia.   Symptomatic PVC's: Continue diltiazem 120 mg daily. Encourage patient to get a smart watch to longitudinally monitor symptoms.  If symptoms become more frequent, could check 2-week Zio patch.  Encouraged to increase fluid intake and wear compression stockings regularly when working outside in the heat.   Mitral regurgitation: Mild, stable. Will check echocardiogram as needed.  FMD of carotid arteries: Historically reported, not appreciated on most recent carotid duplex study.  Absence of bleeding issues, reasonable to continue aspirin     F/u in 1 year  Signed, Elder Negus, MD

## 2023-03-18 NOTE — Patient Instructions (Signed)
Medication Instructions:  The current medical regimen is effective;  continue present plan and medications.  *If you need a refill on your cardiac medications before your next appointment, please call your pharmacy*   Follow-Up: At [facility], you and your health needs are our priority.  As part of our continuing mission to provide you with exceptional heart care, we have created designated Provider Care Teams.  These Care Teams include your primary Cardiologist (physician) and Advanced Practice Providers (APPs -  Physician Assistants and Nurse Practitioners) who all work together to provide you with the care you need, when you need it.  We recommend signing up for the patient portal called "MyChart".  Sign up information is provided on this After Visit Summary.  MyChart is used to connect with patients for Virtual Visits (Telemedicine).  Patients are able to view lab/test results, encounter notes, upcoming appointments, etc.  Non-urgent messages can be sent to your provider as well.   To learn more about what you can do with MyChart, go to [email or web address].    Your next appointment:   1 year(s)  Provider:   Dr Rosemary Holms

## 2023-10-23 ENCOUNTER — Other Ambulatory Visit: Payer: Self-pay | Admitting: Cardiology

## 2023-10-23 DIAGNOSIS — I493 Ventricular premature depolarization: Secondary | ICD-10-CM

## 2024-04-16 ENCOUNTER — Other Ambulatory Visit: Payer: Self-pay | Admitting: Cardiology

## 2024-05-17 ENCOUNTER — Other Ambulatory Visit: Payer: Self-pay | Admitting: Cardiology
